# Patient Record
Sex: Male | Born: 1965 | Race: White | Hispanic: No | Marital: Single | Smoking: Current every day smoker
Health system: Southern US, Community
[De-identification: ages and names within clinical notes are randomized; demographics above are authoritative.]

## PROBLEM LIST (undated history)

## (undated) DIAGNOSIS — F32A Depression, unspecified: Secondary | ICD-10-CM

## (undated) DIAGNOSIS — G4730 Sleep apnea, unspecified: Secondary | ICD-10-CM

## (undated) DIAGNOSIS — I639 Cerebral infarction, unspecified: Secondary | ICD-10-CM

## (undated) DIAGNOSIS — R569 Unspecified convulsions: Secondary | ICD-10-CM

## (undated) DIAGNOSIS — F329 Major depressive disorder, single episode, unspecified: Secondary | ICD-10-CM

## (undated) DIAGNOSIS — F419 Anxiety disorder, unspecified: Secondary | ICD-10-CM

## (undated) DIAGNOSIS — R51 Headache: Secondary | ICD-10-CM

## (undated) DIAGNOSIS — I2510 Atherosclerotic heart disease of native coronary artery without angina pectoris: Secondary | ICD-10-CM

## (undated) DIAGNOSIS — I10 Essential (primary) hypertension: Secondary | ICD-10-CM

## (undated) DIAGNOSIS — R0602 Shortness of breath: Secondary | ICD-10-CM

## (undated) DIAGNOSIS — N189 Chronic kidney disease, unspecified: Secondary | ICD-10-CM

## (undated) DIAGNOSIS — I219 Acute myocardial infarction, unspecified: Secondary | ICD-10-CM

## (undated) HISTORY — PX: LEG SURGERY: SHX1003

---

## 2001-04-14 ENCOUNTER — Encounter: Payer: Self-pay | Admitting: Emergency Medicine

## 2001-04-14 ENCOUNTER — Emergency Department (HOSPITAL_COMMUNITY): Admission: EM | Admit: 2001-04-14 | Discharge: 2001-04-14 | Payer: Self-pay | Admitting: *Deleted

## 2001-07-09 ENCOUNTER — Emergency Department (HOSPITAL_COMMUNITY): Admission: EM | Admit: 2001-07-09 | Discharge: 2001-07-09 | Payer: Self-pay | Admitting: Emergency Medicine

## 2001-07-23 ENCOUNTER — Emergency Department (HOSPITAL_COMMUNITY): Admission: EM | Admit: 2001-07-23 | Discharge: 2001-07-23 | Payer: Self-pay | Admitting: Emergency Medicine

## 2001-07-30 ENCOUNTER — Emergency Department (HOSPITAL_COMMUNITY): Admission: EM | Admit: 2001-07-30 | Discharge: 2001-07-30 | Payer: Self-pay | Admitting: Emergency Medicine

## 2010-10-02 ENCOUNTER — Inpatient Hospital Stay (HOSPITAL_COMMUNITY)
Admission: EM | Admit: 2010-10-02 | Discharge: 2010-10-05 | DRG: 305 | Disposition: A | Discharge status: Home / Self Care | Payer: Self-pay | Source: Ambulatory Visit | Attending: Internal Medicine | Admitting: Internal Medicine

## 2010-10-02 ENCOUNTER — Emergency Department (HOSPITAL_COMMUNITY): Payer: Self-pay

## 2010-10-02 ENCOUNTER — Encounter (HOSPITAL_COMMUNITY): Payer: Self-pay

## 2010-10-02 ENCOUNTER — Inpatient Hospital Stay (HOSPITAL_COMMUNITY): Payer: Self-pay

## 2010-10-02 DIAGNOSIS — I2510 Atherosclerotic heart disease of native coronary artery without angina pectoris: Secondary | ICD-10-CM | POA: Diagnosis present

## 2010-10-02 DIAGNOSIS — K5730 Diverticulosis of large intestine without perforation or abscess without bleeding: Secondary | ICD-10-CM | POA: Diagnosis present

## 2010-10-02 DIAGNOSIS — R112 Nausea with vomiting, unspecified: Secondary | ICD-10-CM | POA: Diagnosis present

## 2010-10-02 DIAGNOSIS — N289 Disorder of kidney and ureter, unspecified: Secondary | ICD-10-CM | POA: Diagnosis present

## 2010-10-02 DIAGNOSIS — E875 Hyperkalemia: Secondary | ICD-10-CM | POA: Diagnosis present

## 2010-10-02 DIAGNOSIS — R079 Chest pain, unspecified: Secondary | ICD-10-CM

## 2010-10-02 DIAGNOSIS — R0789 Other chest pain: Secondary | ICD-10-CM | POA: Diagnosis present

## 2010-10-02 DIAGNOSIS — R51 Headache: Secondary | ICD-10-CM | POA: Diagnosis present

## 2010-10-02 DIAGNOSIS — E119 Type 2 diabetes mellitus without complications: Secondary | ICD-10-CM | POA: Diagnosis present

## 2010-10-02 DIAGNOSIS — F17200 Nicotine dependence, unspecified, uncomplicated: Secondary | ICD-10-CM | POA: Diagnosis present

## 2010-10-02 DIAGNOSIS — E781 Pure hyperglyceridemia: Secondary | ICD-10-CM | POA: Diagnosis present

## 2010-10-02 DIAGNOSIS — I10 Essential (primary) hypertension: Principal | ICD-10-CM | POA: Diagnosis present

## 2010-10-02 DIAGNOSIS — Z7982 Long term (current) use of aspirin: Secondary | ICD-10-CM

## 2010-10-02 DIAGNOSIS — I252 Old myocardial infarction: Secondary | ICD-10-CM

## 2010-10-02 HISTORY — DX: Essential (primary) hypertension: I10

## 2010-10-02 LAB — POCT I-STAT, CHEM 8
BUN: 12 mg/dL (ref 6–23)
Calcium, Ion: 1.2 mmol/L (ref 1.12–1.32)
Chloride: 100 mEq/L (ref 96–112)
Creatinine, Ser: 1.3 mg/dL (ref 0.50–1.35)
Glucose, Bld: 100 mg/dL — ABNORMAL HIGH (ref 70–99)
HCT: 44 % (ref 39.0–52.0)
Hemoglobin: 15 g/dL (ref 13.0–17.0)
Potassium: 2.9 mEq/L — ABNORMAL LOW (ref 3.5–5.1)
Sodium: 140 mEq/L (ref 135–145)
TCO2: 24 mmol/L (ref 0–100)

## 2010-10-02 LAB — CBC
Hemoglobin: 14.4 g/dL (ref 13.0–17.0)
MCH: 29.9 pg (ref 26.0–34.0)
RBC: 4.82 MIL/uL (ref 4.22–5.81)
WBC: 6.9 10*3/uL (ref 4.0–10.5)

## 2010-10-02 LAB — TROPONIN I: Troponin I: <0.3 ng/mL (ref <0.30)

## 2010-10-02 LAB — DIFFERENTIAL
Basophils Absolute: 0.1 10*3/uL (ref 0.0–0.1)
Basophils Relative: 1 % (ref 0–1)
Eosinophils Absolute: 0.2 10*3/uL (ref 0.0–0.7)
Eosinophils Relative: 3 % (ref 0–5)
Lymphocytes Relative: 18 % (ref 12–46)
Lymphs Abs: 1.3 10*3/uL (ref 0.7–4.0)
Monocytes Absolute: 0.7 10*3/uL (ref 0.1–1.0)
Monocytes Relative: 11 % (ref 3–12)
Neutro Abs: 4.6 10*3/uL (ref 1.7–7.7)
Neutrophils Relative %: 67 % (ref 43–77)

## 2010-10-02 LAB — TSH: TSH: 3.215 u[IU]/mL (ref 0.350–4.500)

## 2010-10-02 LAB — CARDIAC PANEL(CRET KIN+CKTOT+MB+TROPI)
CK, MB: 4 ng/mL (ref 0.3–4.0)
Relative Index: INVALID (ref 0.0–2.5)
Total CK: 88 U/L (ref 7–232)

## 2010-10-02 LAB — HEMOGLOBIN A1C
Hgb A1c MFr Bld: 5.4 % (ref <5.7)
Mean Plasma Glucose: 108 mg/dL (ref <117)

## 2010-10-02 LAB — PRO B NATRIURETIC PEPTIDE: Pro B Natriuretic peptide (BNP): 580.7 pg/mL — ABNORMAL HIGH (ref 0–125)

## 2010-10-02 LAB — RAPID URINE DRUG SCREEN, HOSP PERFORMED
Amphetamines: NOT DETECTED
Benzodiazepines: NOT DETECTED
Cocaine: NOT DETECTED
Opiates: NOT DETECTED

## 2010-10-02 LAB — BASIC METABOLIC PANEL
Calcium: 9.7 mg/dL (ref 8.4–10.5)
Creatinine, Ser: 1.77 mg/dL — ABNORMAL HIGH (ref 0.50–1.35)
GFR calc non Af Amer: 41 mL/min — ABNORMAL LOW (ref >60)
Sodium: 139 mEq/L (ref 135–145)

## 2010-10-02 LAB — GLUCOSE, CAPILLARY
Glucose-Capillary: 104 mg/dL — ABNORMAL HIGH (ref 70–99)
Glucose-Capillary: 100 mg/dL — ABNORMAL HIGH (ref 70–99)
Glucose-Capillary: 136 mg/dL — ABNORMAL HIGH (ref 70–99)

## 2010-10-02 LAB — MAGNESIUM: Magnesium: 2.1 mg/dL (ref 1.5–2.5)

## 2010-10-02 LAB — POCT I-STAT TROPONIN I: Troponin i, poc: 0 ng/mL (ref 0.00–0.08)

## 2010-10-03 LAB — RENAL FUNCTION PANEL
CO2: 28 mEq/L (ref 19–32)
Calcium: 9.6 mg/dL (ref 8.4–10.5)
GFR calc Af Amer: 57 mL/min — ABNORMAL LOW (ref >60)
GFR calc non Af Amer: 47 mL/min — ABNORMAL LOW (ref >60)
Glucose, Bld: 170 mg/dL — ABNORMAL HIGH (ref 70–99)
Phosphorus: 3.4 mg/dL (ref 2.3–4.6)
Potassium: 3.6 mEq/L (ref 3.5–5.1)
Sodium: 137 mEq/L (ref 135–145)

## 2010-10-03 LAB — CARDIAC PANEL(CRET KIN+CKTOT+MB+TROPI)
CK, MB: 2.9 ng/mL (ref 0.3–4.0)
Relative Index: INVALID (ref 0.0–2.5)
Total CK: 61 U/L (ref 7–232)

## 2010-10-03 LAB — GLUCOSE, CAPILLARY
Glucose-Capillary: 106 mg/dL — ABNORMAL HIGH (ref 70–99)
Glucose-Capillary: 101 mg/dL — ABNORMAL HIGH (ref 70–99)

## 2010-10-03 LAB — LIPID PANEL
Cholesterol: 146 mg/dL (ref 0–200)
Total CHOL/HDL Ratio: 7.7 RATIO

## 2010-10-04 LAB — BASIC METABOLIC PANEL
CO2: 29 mEq/L (ref 19–32)
Glucose, Bld: 97 mg/dL (ref 70–99)
Potassium: 3.9 mEq/L (ref 3.5–5.1)
Sodium: 139 mEq/L (ref 135–145)

## 2010-10-04 LAB — GLUCOSE, CAPILLARY: Glucose-Capillary: 98 mg/dL (ref 70–99)

## 2010-10-04 LAB — CBC
Hemoglobin: 13.8 g/dL (ref 13.0–17.0)
MCH: 29.6 pg (ref 26.0–34.0)
RBC: 4.66 MIL/uL (ref 4.22–5.81)

## 2010-10-05 ENCOUNTER — Inpatient Hospital Stay (HOSPITAL_COMMUNITY): Payer: Self-pay

## 2010-10-05 LAB — GLUCOSE, CAPILLARY
Glucose-Capillary: 79 mg/dL (ref 70–99)
Glucose-Capillary: 130 mg/dL — ABNORMAL HIGH (ref 70–99)
Glucose-Capillary: 113 mg/dL — ABNORMAL HIGH (ref 70–99)

## 2010-10-05 MED ORDER — GADOBENATE DIMEGLUMINE 529 MG/ML IV SOLN
20.0000 mL | Freq: Once | INTRAVENOUS | Status: AC
  Administered 2010-10-05: 20 mL via INTRAVENOUS

## 2010-10-08 NOTE — Consult Note (Addendum)
NAMESHOLEM, VENHUIZEN               ACCOUNT NO.:  [id number]  MEDICAL RECORD NO.:  [id number]  LOCATION:  [facility]                         FACILITY:  [facility]  PHYSICIAN:  Marcello Moores C. Hubbert Landrigan, MD, FACCDATE OF BIRTH:  22-May-1965  DATE OF CONSULTATION:  10/02/2010 DATE OF DISCHARGE:                                CONSULTATION   PRIMARY CARDIOLOGIST:  [facility].  PRIMARY MEDICAL DOCTOR:  [facility] in [place].  CHIEF COMPLAINT:  Chest pain, headache, nausea, and  vomiting.  HISTORY OF PRESENT ILLNESS:  Mr. Salber is a 45 year old gentleman with a reported history of CAD, hypertension, hyperlipidemia, diabetes, and prior drug and alcohol abuse, who was admitted with chest pain, headache, nausea, vomiting, and hypertensive crisis.  He was walking to the bar last night when he developed sudden left chest pain, which he describes as a sharp knife twisting in his upper chest and then felt someone was bear hugging him.  He felt sweaty, nauseous, and vomited approximately 8 times.  He got too painful and then called EMS.  He states sublingual nitroglycerin made his symptoms worse and give him headache.  Blood pressure was noted to be 250/150 by EMS per ER report. The patient received morphine, Toradol, GI cocktail, and IV nitroglycerin in the ER with gradual reduction and pain down to 2/10. He still has residual headache.  His blood pressures improved to 130s/150s.  EKG demonstrates normal sinus ryythm and ST depression at II, III, aVF, and T-wave inversion V4-V6, although this was taken when his blood pressure was markedly elevated.  His symptoms reminded him of his prior MI, but the pain is reproducible with certain movements like sitting up or moving his arms, he describes it like a pulling sensation. He also has tenderness on his left chest Katriana Dortch, but thinks this may be a different pain.  Troponin is negative x1.  PAST MEDICAL HISTORY: 1. Coronary artery disease per patient report.  He  states he had an MI     in 1998 and 2000 with the one of his catheterization being in     [place] area with angioplasty.  He does not necessarily know all the     details.  He states he had a nuclear study 2 months ago at [facility] 2. Hyperlipidemia/hypertriglyceridemia. 3. Nephrolithiasis. 4. Diverticulitis. 5. Diabetes mellitus. 6. Hypertension. 7. History of alcohol and drug abuse.     a.     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     b.     Hospital admission to [facility], March 2012 demonstrates      that concern for narcotic resistance and the patient requesting      morphine and Dilaudid finding.  SURGICAL HISTORY:  Left lower extremity compartment surgery.  OUTPATIENT MEDICATIONS:  No need to be verified with pharmacy, but the patient is able to request some of the names which include pravastatin, metformin, Glucotrol, aspirin, lisinopril, clonidine, and metoprolol.  ALLERGIES:  IV DYE and KEFLEX.  SOCIAL HISTORY:  Mr. Laughman lives in [place].  He is divorced but is now engaged to be married in  November.  He has one daughter of his own ho is 54 and his fiance has 3 children.  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  He smokes half pack per day.  He states 15 years ago returned sober from alcohol and 15 years ago he also gave up methamphetamine and THC.  Urine drug screen is negative.  FAMILY HISTORY:  Mother had Alzheimer's and passed away of a massive MI in her 22s.  He states his father had a very poor health as a result of alcohol, occluded heart disease, hypertension, and diabetes.  REVIEW OF SYSTEMS:  The patient felt well up until yesterday evening. No fevers, chills.  He has some trace lower extremity edema.  All other systems reviewed and otherwise negative.  LABS:  WBC 6.9, hemoglobin 14.4, hematocrit 40.3, platelet count 235. Sodium 140, potassium 2.9, chloride 100,  glucose 100, BUN 12, creatinine 1.3.  BNP 580.  Point-of-care troponin negative x1. Urine drug screen negative.  Alcohol level does not appear to be have been done.  STUDIES: 1. Chest x-ray showed suboptimal position, but no active disease. 2. EKG normal sinus rhythm with ST depression II, III, aVF, and T-wave     inversion at V4-V6 neck.  PHYSICAL EXAMINATION:  VITAL SIGNS:  Temperature 98.5, pulse 85, respirations 18, blood pressure 125/66, pulse ox 99% on 2 liters. GENERAL:  This is a pleasant, but somewhat anxious appearing white male, in no acute distress. HEENT:  Normocephalic, atraumatic with extraocular movements intact. Clear sclerae.  Nares are without discharge. NECK:  Supple with a left carotid bruit. HEART:  Auscultation of the heart reveals regular rate and rhythm with S1 and S2 without murmurs, rubs, or gallops. LUNGS:  Clear to auscultation bilaterally without wheezes, rales, or rhonchi. ABDOMEN:  Soft, nontender, nontender.  Positive bowel sounds. EXTREMITIES:  Warm, dry, with trace sock line edema. NEUROLOGIC:  He is alert and oriented x3, responds questions appropriately, but had an anxious affect and at times seems preoccupied with bringing up his weight.  ASSESSMENT AND PLAN:  The patient was seen and examined by Dr. Verl Blalock and myself.  This is a 45 year old gentleman with reported history of coronary artery disease, hypertension, diabetes, prior drug and alcohol abuse with negative urine drug screen, who presents with symptoms of chest pain, nausea, vomiting, and headache in the setting of hypertensive crisis.  His chest pain at this time is felt somewhat atypical, and is reproducible with certain movements.  However, does remind him of his prior MI.  We do recommend that the patient be admitted to hospital for rule out cardiac enzymes.  EKG changes may be due to hypertension, but will repeat in the morning.  He reportedly had a stress test 2 months ago, the  results of which we will attempt to obtain.  Once the patient's home medications are known, we would recommend to continue a good cardiac regimen that would include beta-blockade and statin on top of the aspirin and IV nitroglycerin that he will already be receiving.  Further recommendations will be determined based on the above clinical data.     Dayna Dunn, P.A.C.   ______________________________ Marijo Conception Verl Blalock, MD, [facility]    DD/MEDQ  D:  10/02/2010  T:  10/02/2010  Job:  [id number]  cc:   Juley Giovanetti C. Verl Blalock, MD, [facility]  Electronically Signed by Melina Copa  on 10/17/2010 09:23:10 PM Electronically Signed by Jenell Milliner MD [facility]  on 11/04/2010 01:46:48 PM

## 2010-10-14 NOTE — H&P (Signed)
NAMEDALEON, BUZBY NO.:  [id number]  MEDICAL RECORD NO.:  [id number]  LOCATION:  [facility]                         FACILITY:  [facility]  PHYSICIAN:  Rise Patience, MDDATE OF BIRTH:  [birth year]  DATE OF ADMISSION:  10/02/2010 DATE OF DISCHARGE:                             HISTORY & PHYSICAL   PRIMARY CARE PHYSICIAN:  Unassigned.  [facility]  CHIEF COMPLAINT:  Chest pain.  HISTORY OF PRESENT ILLNESS:  A 45 year old male with history of hypertension, diabetes mellitus type 2, previous history of coronary artery disease, has had undergone cardiac cath and had balloon angioplasty with no stents or surgery.  Has been experiencing chest pains since last afternoon.  The pain is more retrosternal with almost radiating to the neck along with the patient also had a severe headache which also was referring to his neck.  In the ER, the patient was found to have a high blood pressure of 181/30, was started on IV nitroglycerin.  The headache is largely resolved.  His chest is also resolved at this time.  The patient was admitted for further observation.  His cardiac enzymes have been negative.  EKG showing some nonspecific changes, slightly there is some T-wave changes in the lateral and inferior leads, but we do not have an old EKG to compare.  The patient denies any shortness of breath, denies any nausea, vomiting, abdominal pain, dizziness, loss of conscious, any visual symptoms. Denies any focal deficit.  Denies any abdominal pain, dysuria, discharge or diarrhea.  PAST MEDICAL HISTORY:  Diabetes mellitus type 2, hypertension, history of CAD status post balloon angioplasty with no stents or CABG.  MEDICATIONS PRIOR TO ADMISSION:  The patient is on aspirin, clonidine 0.3 mg p.o. t.i.d., metoprolol.  He is on metformin and Glucotrol, dose have to be verified.  ALLERGIES:  IV DYE, KEFLEX.  SOCIAL HISTORY:  The patient smokes  cigarettes.  Denies any alcohol or drug abuse.  Used to drink a lot before, but now he is cleared for almost 15 years.  FAMILY HISTORY:  Positive for Alzheimer dementia and diabetes and coronary artery disease.  REVIEW OF SYSTEMS:  As per history of present illness nothing else significant.  PHYSICAL EXAMINATION:  GENERAL:  The patient examined at bedside not in acute distress. VITAL SIGNS:  Blood pressure 130/80, pulse 85 per minute, temperature 98.5, respirations 18 per minute, O2 sat 98%. HEENT:  Anicteric.  No pallor.  No discharge from ears, eyes, nose or mouth. CHEST:  Bilateral air entry present.  No rhonchi,  no crepitation. HEART:  S1 and S2 heard. ABDOMEN:  Soft, nontender.  Bowel sounds heard. CNS:  The patient is alert, awake and oriented to time, place and person.  Moves upper and lower extremities 5/5. EXTREMITIES:  Peripheral pulses felt.  No edema.  No acute ischemic changes, cyanosis or clubbing.\  LABORATORY DATA:  EKG shows normal sinus rhythm with poor R-wave progression with T-wave changes in the lateral leads and inferior leads. I did not have any old EKG to compare.  Chest x-ray shows suboptimal positioning within its limitation, no acute process identified.  CBC WBC is 6.5, hemoglobin 15,  hematocrit 44, platelets 235.  Basic metabolic panel, sodium [id number], potassium 2.9, chloride 100, carbon dioxide 24, glucose 100, BUN 4, creatinine 1.3, troponin 0, BNP 580.  Drug screen is negative.  ASSESSMENT: 1. Chest pain. 2. Accelerated hypertension. 3. History of coronary artery disease, status post balloon     angioplasty. 4. History of headache. 5. History of diabetes mellitus type 2. 6. History of tobacco abuse. 7. Hyperkalemia.  PLAN: 1. At this time, admit the patient to telemetry. 2. For his chest pain and accelerated hypertension, the patient has     been started on IV nitroglycerin which we will continue for now.     We will continue his clonidine he  states he takes 0.3 p.o. t.i.d.,     and he will also continue his metoprolol dose has to be verified.     We are going to cycle cardiac markers, get 2-D echo.  I have     consulted [facility], we will follow with their     recommendation at this time. 3. Hypokalemia.  At this time, we are going to replace his potassium.     Given his history of hypertension, we need to check for secondary     cause of hypertension.  At this time, we are going to order serum     renin and aldosterone check ratio to primary rule out any primary     hyperaldosteronism. 4. Diabetes mellitus type 2, we will continue his present medications,     he says he is on metformin and Glucotrol.  We will hold metformin     while he is in the hospital.  We will place the patient on sliding     scale coverage for now.  We will check hemoglobin A1c. 5. The patient needs smoking cessation counseling. 6. For his headache.  At this time, his headache has resolved with     blood pressure control.  We are going to get a CT head without     contrast. 7. Further recommendation based on test order, clinical course and     consults recommendations.     Rise Patience, MD     ANK/MEDQ  D:  10/02/2010  T:  10/02/2010  Job:  [id number]  Electronically Signed by Gean Birchwood MD on 10/14/2010 11:59:54 AM

## 2010-10-25 NOTE — Discharge Summary (Signed)
Austin Shepard, JUHNKE NO.:  [id number]  MEDICAL RECORD NO.:  [id number]  LOCATION:  2030                         FACILITY:  [facility]  PHYSICIAN:  Leana Gamer, MDDATE OF BIRTH:  1965/07/04  DATE OF ADMISSION:  10/02/2010 DATE OF DISCHARGE:  10/05/2010                              DISCHARGE SUMMARY   DISCHARGE DISPOSITION:  Home.  FINAL DISCHARGE DIAGNOSES: 1. Chest pain felt to be noncardiac. 2. Malignant hypertension, now better controlled. 3. Headache, controlled. 4. Diabetes type 2, controlled. 5. Coronary artery disease, status post angioplasty without any     standing or bypass. 6. Chronic diastolic dysfunction, compensated. 7. Hypertriglyceridemia/hyperlipidemia.  Discharge medications include the following: 1. Metformin 500 mg p.o. b.i.d. 2. Glucotrol 5 mg p.o. b.i.d. 3. Aspirin 325 mg p.o. daily. 4. Fioricet one tablet p.o. q.6 h. p.r.n. pain. 5. Hydrochlorothiazide 12.5 mg p.o. daily. 6. Lisinopril 20 mg p.o. daily. 7. Metoprolol XL 25 mg p.o. daily. 8. Simvastatin 20 mg p.o. daily. 9. Clonidine 0.3 mg p.o. t.i.d.  CONSULTANTS:  Dr. Jenell Milliner, [facility].  PROCEDURES:  None.  DIAGNOSTIC STUDIES: 1. Portable chest x-ray on admission, which shows no acute process. 2. CT of the head without contrast, which shows no acute     intraparenchymal disease.  Minimal sinus disease.  No air-fluid     levels. 3. MRA of the abdomen with and without contrast, which shows normal.     MRA demonstrated widely patent bilateral single renal arteries     without evidence of renal artery stenosis or fibromuscular     dysplasia.  The abdominal aorta and other distal branch vessels are     normal. 4. A 2-D echocardiogram without contrast, which shows normal left     ventricular function with ejection fraction of 60-65%.  There is     moderate concentric hypertrophy and Doppler parameters consistent     with grade 1 diastolic  dysfunction.  PRIMARY CARE PHYSICIAN:  The patient attends the Adult [facility] in [facility].  ALLERGIES: 1. KEFLEX. 2. IVP DYE.  CODE STATUS:  Full code.  CHIEF COMPLAINT:  Chest pain.  HISTORY OF PRESENT ILLNESS:  Please refer to the H and P by Dr. Hal Hope for details of the HPI; however, in short, this is a 45 year old gentleman with a history of coronary artery disease, hypertension, diabetes type 2, who presents to the emergency room with chest pain. The patient is also found to have elevated blood pressures of 181/113 and was started on IV nitroglycerin.  The patient was refer to [facility] for further evaluation and management.  HOSPITAL COURSE: 1. Malignant hypertension.  The patient was found to have malignant     hypertension, which is felt to be possibly contributing to his     chest pain.  The patient was placed on IV nitroglycerin and then     transitioned over to oral hypertensive.  The patient's blood     pressures now under good control with oral medications.  The     patient is asked to follow up with his primary physician within a     week for further evaluation and titration of medications.  The     patient does have a blood pressure cuff at home and does take his     blood pressures.  He states that usually at home on several times a     week his blood pressures are diastolics greater than [id number] and     systolic greater than [id number].  It is clear that this patient needs     close follow up with his blood pressure as an outpatient. 2. Diabetes type 2.  The patient had diabetes type 2, it is controlled     based on a hemoglobin A1c of 5.3.  The patient takes metformin and     Glucotrol at home.  During his hospitalization, however, because of     the studies, he was taken off of his medications and treated with     insulin.  The patient will be resumed on his medications.  As he     takes these as an outpatient as they have been providing good      control. 3. Chest pain.  The chest pain was found to be atypical.  In light of     the patient having coronary artery disease, Cardiology was     consulted.  However, they felt that this pain was atypical and did     not represent an acute coronary syndrome and recommended that the     patient continue to have good medical management as noted above. 4. Hyperlipidemia.  The patient was found to have triglycerides     elevated at 517, thus VLDL could not be calculated.  The patient is     on simvastatin and should have his hypertriglyceridemia further to     be evaluated as an outpatient.  At the time of discharge, the patient is stable.  PHYSICAL EXAMINATION:  VITAL SIGNS:  Temperature is 98.8, heart rate 74, blood pressure 115/76, respiratory rate 18, O2 sats are 96% on room air. GENERAL:  The patient is up ambulating without any dizziness or any signs of orthostatic hypertension. HEENT:  He is normocephalic and atraumatic.  Pupils are equally round and reactive to light and accommodation.  Extraocular movements are intact.  Oropharynx is moist.  No exudate, erythema or lesions are noted. NECK:  The trachea is midline.  No masses, no thyromegaly, no JVD, no carotid bruit. RESPIRATORY:  The patient has a normal respiratory effort, equal excursion bilaterally, and no wheezing or rhonchi noted. CARDIOVASCULAR:  He has got a normal S1 and S2.  No murmurs, rubs, or gallops are noted.  PMI is nondisplaced.  No heaves or thrills on palpation. ABDOMEN:  Obese, soft, nontender, and nondistended.  No masses, no hepatosplenomegaly is noted. EXTREMITIES:  Show no clubbing, cyanosis, or edema. NEUROLOGIC:  The patient has no focal neurological deficits.  Cranial nerves II through XII are grossly intact. PSYCHIATRIC:  The patient is alert and oriented x3.  DIETARY RESTRICTIONS:  The patient should be on a diabetic heart-healthy diet.  PHYSICAL RESTRICTIONS:  None.  FOLLOWUP:  The patient  has a followup appointment scheduled with the [facility] in [facility] on October 22, 2010 at 1:15 p.m.     Leana Gamer, MD     MAM/MEDQ  D:  10/05/2010  T:  10/06/2010  Job:  [id number]  cc:   Thomas C. Verl Blalock, MD, [facility]  Electronically Signed by Liston Alba MD on 10/25/2010 07:23:22 AM

## 2012-05-28 ENCOUNTER — Inpatient Hospital Stay (HOSPITAL_COMMUNITY)
Admission: EM | Admit: 2012-05-28 | Discharge: 2012-05-30 | DRG: 303 | Disposition: A | Discharge status: Home / Self Care | Payer: Medicaid Other | Attending: Internal Medicine | Admitting: Internal Medicine

## 2012-05-28 ENCOUNTER — Encounter (HOSPITAL_COMMUNITY): Payer: Self-pay | Admitting: Emergency Medicine

## 2012-05-28 ENCOUNTER — Inpatient Hospital Stay (HOSPITAL_COMMUNITY): Payer: Medicaid Other

## 2012-05-28 ENCOUNTER — Emergency Department (HOSPITAL_COMMUNITY): Payer: Medicaid Other

## 2012-05-28 DIAGNOSIS — K219 Gastro-esophageal reflux disease without esophagitis: Secondary | ICD-10-CM

## 2012-05-28 DIAGNOSIS — I2510 Atherosclerotic heart disease of native coronary artery without angina pectoris: Principal | ICD-10-CM

## 2012-05-28 DIAGNOSIS — F329 Major depressive disorder, single episode, unspecified: Secondary | ICD-10-CM | POA: Diagnosis present

## 2012-05-28 DIAGNOSIS — I252 Old myocardial infarction: Secondary | ICD-10-CM

## 2012-05-28 DIAGNOSIS — I10 Essential (primary) hypertension: Secondary | ICD-10-CM

## 2012-05-28 DIAGNOSIS — F3289 Other specified depressive episodes: Secondary | ICD-10-CM | POA: Diagnosis present

## 2012-05-28 DIAGNOSIS — F411 Generalized anxiety disorder: Secondary | ICD-10-CM | POA: Diagnosis present

## 2012-05-28 DIAGNOSIS — R29898 Other symptoms and signs involving the musculoskeletal system: Secondary | ICD-10-CM | POA: Diagnosis present

## 2012-05-28 DIAGNOSIS — K920 Hematemesis: Secondary | ICD-10-CM | POA: Diagnosis present

## 2012-05-28 DIAGNOSIS — E669 Obesity, unspecified: Secondary | ICD-10-CM | POA: Diagnosis present

## 2012-05-28 DIAGNOSIS — Z91199 Patient's noncompliance with other medical treatment and regimen due to unspecified reason: Secondary | ICD-10-CM

## 2012-05-28 DIAGNOSIS — Z9981 Dependence on supplemental oxygen: Secondary | ICD-10-CM

## 2012-05-28 DIAGNOSIS — R072 Precordial pain: Secondary | ICD-10-CM

## 2012-05-28 DIAGNOSIS — F17200 Nicotine dependence, unspecified, uncomplicated: Secondary | ICD-10-CM | POA: Diagnosis present

## 2012-05-28 DIAGNOSIS — E119 Type 2 diabetes mellitus without complications: Secondary | ICD-10-CM | POA: Diagnosis present

## 2012-05-28 DIAGNOSIS — F1911 Other psychoactive substance abuse, in remission: Secondary | ICD-10-CM | POA: Diagnosis present

## 2012-05-28 DIAGNOSIS — Z720 Tobacco use: Secondary | ICD-10-CM | POA: Diagnosis present

## 2012-05-28 DIAGNOSIS — F32A Depression, unspecified: Secondary | ICD-10-CM | POA: Diagnosis present

## 2012-05-28 DIAGNOSIS — G8929 Other chronic pain: Secondary | ICD-10-CM | POA: Diagnosis present

## 2012-05-28 DIAGNOSIS — R51 Headache: Secondary | ICD-10-CM

## 2012-05-28 DIAGNOSIS — Z6841 Body Mass Index (BMI) 40.0 and over, adult: Secondary | ICD-10-CM

## 2012-05-28 DIAGNOSIS — E876 Hypokalemia: Secondary | ICD-10-CM

## 2012-05-28 DIAGNOSIS — Z9119 Patient's noncompliance with other medical treatment and regimen: Secondary | ICD-10-CM

## 2012-05-28 DIAGNOSIS — R519 Headache, unspecified: Secondary | ICD-10-CM | POA: Diagnosis present

## 2012-05-28 DIAGNOSIS — F419 Anxiety disorder, unspecified: Secondary | ICD-10-CM | POA: Diagnosis present

## 2012-05-28 DIAGNOSIS — Z8673 Personal history of transient ischemic attack (TIA), and cerebral infarction without residual deficits: Secondary | ICD-10-CM

## 2012-05-28 DIAGNOSIS — Z794 Long term (current) use of insulin: Secondary | ICD-10-CM

## 2012-05-28 DIAGNOSIS — I69998 Other sequelae following unspecified cerebrovascular disease: Secondary | ICD-10-CM

## 2012-05-28 DIAGNOSIS — R079 Chest pain, unspecified: Secondary | ICD-10-CM

## 2012-05-28 DIAGNOSIS — K922 Gastrointestinal hemorrhage, unspecified: Secondary | ICD-10-CM

## 2012-05-28 DIAGNOSIS — G4733 Obstructive sleep apnea (adult) (pediatric): Secondary | ICD-10-CM | POA: Diagnosis present

## 2012-05-28 HISTORY — DX: Acute myocardial infarction, unspecified: I21.9

## 2012-05-28 HISTORY — DX: Major depressive disorder, single episode, unspecified: F32.9

## 2012-05-28 HISTORY — DX: Atherosclerotic heart disease of native coronary artery without angina pectoris: I25.10

## 2012-05-28 HISTORY — DX: Sleep apnea, unspecified: G47.30

## 2012-05-28 HISTORY — DX: Shortness of breath: R06.02

## 2012-05-28 HISTORY — DX: Unspecified convulsions: R56.9

## 2012-05-28 HISTORY — DX: Headache: R51

## 2012-05-28 HISTORY — DX: Chronic kidney disease, unspecified: N18.9

## 2012-05-28 HISTORY — DX: Depression, unspecified: F32.A

## 2012-05-28 HISTORY — DX: Cerebral infarction, unspecified: I63.9

## 2012-05-28 HISTORY — DX: Anxiety disorder, unspecified: F41.9

## 2012-05-28 LAB — HEMOGLOBIN AND HEMATOCRIT, BLOOD
HCT: 41 % (ref 39.0–52.0)
Hemoglobin: 14.7 g/dL (ref 13.0–17.0)

## 2012-05-28 LAB — BASIC METABOLIC PANEL
GFR calc Af Amer: 82 mL/min — ABNORMAL LOW (ref >90)
GFR calc non Af Amer: 71 mL/min — ABNORMAL LOW (ref >90)
Potassium: 3 mEq/L — ABNORMAL LOW (ref 3.5–5.1)
Sodium: 141 mEq/L (ref 135–145)

## 2012-05-28 LAB — GLUCOSE, CAPILLARY: Glucose-Capillary: 124 mg/dL — ABNORMAL HIGH (ref 70–99)

## 2012-05-28 LAB — CBC
Hemoglobin: 15.4 g/dL (ref 13.0–17.0)
MCHC: 36 g/dL (ref 30.0–36.0)
RBC: 5.06 MIL/uL (ref 4.22–5.81)

## 2012-05-28 LAB — HEPATIC FUNCTION PANEL
ALT: 20 U/L (ref 0–53)
AST: 22 U/L (ref 0–37)
Alkaline Phosphatase: 94 U/L (ref 39–117)
Bilirubin, Direct: <0.1 mg/dL (ref 0.0–0.3)
Total Bilirubin: 0.3 mg/dL (ref 0.3–1.2)

## 2012-05-28 LAB — POCT I-STAT TROPONIN I: Troponin i, poc: 0 ng/mL (ref 0.00–0.08)

## 2012-05-28 LAB — TYPE AND SCREEN
ABO/RH(D): O POS
Antibody Screen: NEGATIVE

## 2012-05-28 MED ORDER — SODIUM CHLORIDE 0.9 % IJ SOLN
3.0000 mL | Freq: Two times a day (BID) | INTRAMUSCULAR | Status: DC
  Administered 2012-05-28 – 2012-05-30 (×4): 3 mL via INTRAVENOUS

## 2012-05-28 MED ORDER — CLONAZEPAM 1 MG PO TABS
2.0000 mg | ORAL_TABLET | Freq: Every day | ORAL | Status: DC
  Administered 2012-05-28 – 2012-05-29 (×2): 2 mg via ORAL
  Filled 2012-05-28 (×2): qty 2

## 2012-05-28 MED ORDER — OXYBUTYNIN CHLORIDE ER 5 MG PO TB24
5.0000 mg | ORAL_TABLET | Freq: Every day | ORAL | Status: DC
  Administered 2012-05-28 – 2012-05-29 (×2): 5 mg via ORAL
  Filled 2012-05-28 (×4): qty 1

## 2012-05-28 MED ORDER — MORPHINE SULFATE 4 MG/ML IJ SOLN
4.0000 mg | Freq: Once | INTRAMUSCULAR | Status: AC
  Administered 2012-05-28: 4 mg via INTRAVENOUS
  Filled 2012-05-28: qty 1

## 2012-05-28 MED ORDER — INSULIN ASPART 100 UNIT/ML ~~LOC~~ SOLN
0.0000 [IU] | Freq: Three times a day (TID) | SUBCUTANEOUS | Status: DC
  Administered 2012-05-29: 1 [IU] via SUBCUTANEOUS

## 2012-05-28 MED ORDER — LORAZEPAM 2 MG/ML IJ SOLN
1.0000 mg | Freq: Once | INTRAMUSCULAR | Status: AC
  Administered 2012-05-28: 1 mg via INTRAVENOUS
  Filled 2012-05-28: qty 1

## 2012-05-28 MED ORDER — METOPROLOL TARTRATE 1 MG/ML IV SOLN
5.0000 mg | Freq: Once | INTRAVENOUS | Status: AC
  Administered 2012-05-28: 5 mg via INTRAVENOUS
  Filled 2012-05-28: qty 5

## 2012-05-28 MED ORDER — NITROGLYCERIN 2 % TD OINT
1.0000 [in_us] | TOPICAL_OINTMENT | Freq: Four times a day (QID) | TRANSDERMAL | Status: DC
  Administered 2012-05-28 – 2012-05-29 (×3): 1 [in_us] via TOPICAL
  Filled 2012-05-28: qty 30

## 2012-05-28 MED ORDER — PANTOPRAZOLE SODIUM 40 MG IV SOLR
40.0000 mg | Freq: Once | INTRAVENOUS | Status: DC
  Filled 2012-05-28: qty 40

## 2012-05-28 MED ORDER — ACETAMINOPHEN 650 MG RE SUPP: 650.0000 mg | Freq: Four times a day (QID) | RECTAL | Status: DC | PRN

## 2012-05-28 MED ORDER — MORPHINE SULFATE 4 MG/ML IJ SOLN
6.0000 mg | Freq: Once | INTRAMUSCULAR | Status: AC
  Administered 2012-05-28: 6 mg via INTRAVENOUS
  Filled 2012-05-28 (×2): qty 1

## 2012-05-28 MED ORDER — HYDROMORPHONE HCL PF 1 MG/ML IJ SOLN
1.0000 mg | INTRAMUSCULAR | Status: DC | PRN
  Administered 2012-05-28: 1 mg via INTRAVENOUS
  Administered 2012-05-29: 2 mg via INTRAVENOUS
  Administered 2012-05-29: 1 mg via INTRAVENOUS
  Filled 2012-05-28 (×2): qty 2
  Filled 2012-05-28 (×2): qty 1

## 2012-05-28 MED ORDER — NICOTINE 7 MG/24HR TD PT24
7.0000 mg | MEDICATED_PATCH | Freq: Every day | TRANSDERMAL | Status: DC
  Administered 2012-05-28 – 2012-05-30 (×3): 7 mg via TRANSDERMAL
  Filled 2012-05-28 (×4): qty 1

## 2012-05-28 MED ORDER — POTASSIUM CHLORIDE 10 MEQ/100ML IV SOLN
10.0000 meq | INTRAVENOUS | Status: AC
  Administered 2012-05-28 (×4): 10 meq via INTRAVENOUS
  Filled 2012-05-28: qty 100
  Filled 2012-05-28: qty 200
  Filled 2012-05-28: qty 100

## 2012-05-28 MED ORDER — ONDANSETRON HCL 4 MG/2ML IJ SOLN: 4.0000 mg | Freq: Three times a day (TID) | INTRAMUSCULAR | Status: AC | PRN

## 2012-05-28 MED ORDER — ACETAMINOPHEN 325 MG PO TABS
650.0000 mg | ORAL_TABLET | Freq: Four times a day (QID) | ORAL | Status: DC | PRN
  Administered 2012-05-29: 650 mg via ORAL
  Filled 2012-05-28: qty 2

## 2012-05-28 MED ORDER — SODIUM CHLORIDE 0.9 % IV SOLN
50.0000 ug/h | INTRAVENOUS | Status: DC
  Administered 2012-05-28: 50 ug/h via INTRAVENOUS
  Filled 2012-05-28 (×2): qty 1

## 2012-05-28 MED ORDER — ONDANSETRON HCL 4 MG/2ML IJ SOLN
4.0000 mg | Freq: Once | INTRAMUSCULAR | Status: AC
  Administered 2012-05-28: 4 mg via INTRAVENOUS
  Filled 2012-05-28: qty 2

## 2012-05-28 MED ORDER — PANTOPRAZOLE SODIUM 40 MG IV SOLR
40.0000 mg | Freq: Two times a day (BID) | INTRAVENOUS | Status: DC
  Administered 2012-05-28: 40 mg via INTRAVENOUS
  Filled 2012-05-28 (×3): qty 40

## 2012-05-28 MED ORDER — LAMOTRIGINE 200 MG PO TABS
200.0000 mg | ORAL_TABLET | Freq: Two times a day (BID) | ORAL | Status: DC
  Administered 2012-05-28 – 2012-05-30 (×4): 200 mg via ORAL
  Filled 2012-05-28 (×7): qty 1

## 2012-05-28 MED ORDER — ONDANSETRON HCL 4 MG PO TABS
4.0000 mg | ORAL_TABLET | Freq: Four times a day (QID) | ORAL | Status: DC | PRN
  Administered 2012-05-30: 4 mg via ORAL
  Filled 2012-05-28: qty 1

## 2012-05-28 MED ORDER — SENNOSIDES-DOCUSATE SODIUM 8.6-50 MG PO TABS
1.0000 | ORAL_TABLET | Freq: Every evening | ORAL | Status: DC | PRN
  Filled 2012-05-28: qty 1

## 2012-05-28 MED ORDER — TOPIRAMATE 25 MG PO TABS
50.0000 mg | ORAL_TABLET | Freq: Two times a day (BID) | ORAL | Status: DC
  Administered 2012-05-28 – 2012-05-30 (×4): 50 mg via ORAL
  Filled 2012-05-28 (×6): qty 2

## 2012-05-28 MED ORDER — ONDANSETRON HCL 4 MG/2ML IJ SOLN
4.0000 mg | Freq: Four times a day (QID) | INTRAMUSCULAR | Status: DC | PRN
  Administered 2012-05-28 – 2012-05-29 (×2): 4 mg via INTRAVENOUS
  Filled 2012-05-28 (×2): qty 2

## 2012-05-28 MED ORDER — SODIUM CHLORIDE 0.9 % IV SOLN
INTRAVENOUS | Status: DC
  Administered 2012-05-28: 1000 mL via INTRAVENOUS

## 2012-05-28 MED ORDER — FENTANYL CITRATE 0.05 MG/ML IJ SOLN
100.0000 ug | Freq: Once | INTRAMUSCULAR | Status: AC
  Administered 2012-05-28: 100 ug via INTRAVENOUS
  Filled 2012-05-28: qty 2

## 2012-05-28 MED ORDER — CARBAMAZEPINE 200 MG PO TABS
400.0000 mg | ORAL_TABLET | Freq: Two times a day (BID) | ORAL | Status: DC
  Administered 2012-05-28 – 2012-05-30 (×4): 400 mg via ORAL
  Filled 2012-05-28 (×7): qty 2

## 2012-05-28 MED ORDER — NICARDIPINE HCL IN NACL 20-0.86 MG/200ML-% IV SOLN
5.0000 mg/h | Freq: Once | INTRAVENOUS | Status: AC
  Administered 2012-05-28: 5 mg/h via INTRAVENOUS
  Filled 2012-05-28: qty 200

## 2012-05-28 MED ORDER — GABAPENTIN 300 MG PO CAPS
600.0000 mg | ORAL_CAPSULE | Freq: Three times a day (TID) | ORAL | Status: DC
  Administered 2012-05-28 – 2012-05-30 (×5): 600 mg via ORAL
  Filled 2012-05-28 (×10): qty 2

## 2012-05-28 MED ORDER — LABETALOL HCL 200 MG PO TABS
200.0000 mg | ORAL_TABLET | Freq: Three times a day (TID) | ORAL | Status: DC
  Administered 2012-05-29 (×2): 200 mg via ORAL
  Filled 2012-05-28 (×5): qty 1

## 2012-05-28 MED ORDER — NITROGLYCERIN 0.4 MG SL SUBL
0.4000 mg | SUBLINGUAL_TABLET | SUBLINGUAL | Status: AC | PRN
  Administered 2012-05-28 (×3): 0.4 mg via SUBLINGUAL
  Filled 2012-05-28: qty 25

## 2012-05-28 MED ORDER — HYDRALAZINE HCL 50 MG PO TABS
100.0000 mg | ORAL_TABLET | Freq: Three times a day (TID) | ORAL | Status: DC
  Administered 2012-05-28 – 2012-05-29 (×2): 100 mg via ORAL
  Filled 2012-05-28 (×6): qty 2

## 2012-05-28 MED ORDER — PANTOPRAZOLE SODIUM 40 MG IV SOLR
40.0000 mg | Freq: Once | INTRAVENOUS | Status: AC
  Administered 2012-05-28: 40 mg via INTRAVENOUS

## 2012-05-28 MED ORDER — BUSPIRONE HCL 10 MG PO TABS
10.0000 mg | ORAL_TABLET | Freq: Three times a day (TID) | ORAL | Status: DC
  Administered 2012-05-28 – 2012-05-30 (×5): 10 mg via ORAL
  Filled 2012-05-28 (×10): qty 1

## 2012-05-28 MED ORDER — MORPHINE SULFATE 4 MG/ML IJ SOLN: 4.0000 mg | Freq: Once | INTRAMUSCULAR | Status: DC

## 2012-05-28 NOTE — ED Provider Notes (Addendum)
History     CSN: [id number]  Arrival date & time 05/28/12  81   First MD Initiated Contact with Patient 05/28/12 1300      Chief Complaint  Patient presents with  . Chest Pain  . Shortness of Breath   Chief complaint:   Chest pain, vomiting  (Consider location/radiation/quality/duration/timing/severity/associated sxs/prior treatment) HPI.... chest pain since 10:00 this morning with associated vomiting. Also patient complains of headache and numbness of left arm. He has not been any using NSAIDs.  Used nitroglycerin at home with minimal success. 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 Also has hypertension. In the emergency department, he vomited approximately 100 cc of red blood. Level V caveat for urgent intervention  Past Medical History  Diagnosis Date  . Hypertension   . Diabetes mellitus     History reviewed. No pertinent past surgical history.  No family history on file.  History  Substance Use Topics  . Smoking status: Current Every Day Smoker -- 0.50 packs/day  . Smokeless tobacco: Not on file  . Alcohol Use: Not on file      Review of Systems  Unable to perform ROS: Acuity of condition    Allergies  Depakote; Ivp dye; Keflex; and Other  Home Medications  No current outpatient prescriptions on file.  BP 209/171  Pulse 94  Temp(Src) 98.1 F (36.7 C) (Oral)  Resp 18  Ht 5\' 4"  (1.626 m)  Wt 256 lb (116.121 kg)  BMI 43.92 kg/m2  SpO2 100%  Physical Exam  Nursing note and vitals reviewed. Constitutional: He is oriented to person, place, and time.  Obese, vomiting blood.  HENT:  Head: Normocephalic and atraumatic.  Eyes: Conjunctivae and EOM are normal. Pupils are equal, round, and reactive to light.  Neck: Normal range of motion. Neck supple.  Cardiovascular: Normal rate, regular rhythm and normal heart sounds.   Pulmonary/Chest: Effort normal and breath sounds normal.  Abdominal: Soft. Bowel sounds are normal.  Musculoskeletal:  Normal range of motion.  Neurological: He is alert and oriented to person, place, and time.  Skin: Skin is warm and dry.  Psychiatric: He has a normal mood and affect.    ED Course  Procedures (including critical care time)  Labs Reviewed  BASIC METABOLIC PANEL - Abnormal; Notable for the following:    Potassium 3.0 (*)    Glucose, Bld 108 (*)    GFR calc non Af Amer 71 (*)    GFR calc Af Amer 82 (*)    All other components within normal limits  CBC  POCT I-STAT TROPONIN I  TYPE AND SCREEN  ABO/RH    Dg Chest Portable 1 View  05/28/2012   *RADIOLOGY REPORT*  Clinical Data: Hematemesis.  Chest pain.  Shortness breath.  PORTABLE CHEST - 1 VIEW  Comparison: 10/02/2010  Findings: Chronic right mid thoracic rib deformity.  Otherwise unremarkable exam.  IMPRESSION:  1.  No acute findings.   Original Report Authenticated By: Van Clines, M.D.   No results found.   No diagnosis found.  Date: 05/28/2012  Rate: 104  Rhythm: sinus tachy  QRS Axis: normal  Intervals: normal  ST/T Wave abnormalities: normal  Conduction Disutrbances: none  Narrative Interpretation: unremarkable   CRITICAL CARE Performed by: Nat Christen Total critical care time: 30 Critical care time was exclusive of separately billable procedures and treating other patients. Critical care was necessary to treat or prevent imminent or life-threatening deterioration. Critical care was time spent personally by me on  the following activities: development of treatment plan with patient and/or surrogate as well as nursing, discussions with consultants, evaluation of patient's response to treatment, examination of patient, obtaining history from patient or surrogate, ordering and performing treatments and interventions, ordering and review of laboratory studies, ordering and review of radiographic studies, pulse oximetry and re-evaluation of patient's condition.   MDM  Suspect hemorrhagic gastritis as etiology of  hematemesis.   Will start Protonix drip. IV Lopressor and Cardene drip for blood pressure management.   Pain management. Admit to step down        Nat Christen, MD 05/28/12 [place], MD 05/28/12 [place], MD 06/10/12 [place], MD 06/10/12 [place], MD 06/10/12 [place], MD 06/16/12 [place], MD 06/16/12 [place], MD 06/16/12 [place], MD 07/08/12 1355

## 2012-05-28 NOTE — ED Notes (Signed)
CP decreased to 3/10 after 2nd NTG Sl tab.

## 2012-05-28 NOTE — H&P (Addendum)
[facility] History and Physical  Austin Shepard [id number] DOB: 18-Feb-1965 DOA: 05/28/2012  Referring physician: Nat Christen PCP:  Patient cannot remember Specialists:  Cardiologist in [place]  Chief Complaint: chest pain, headache, nausea  HPI: Austin Shepard is a 47 y.o. male with multiple medical problems including heart disease per his report who presents with crushing left-sided chest pain. He has multiple other complaints as well. Currently, his most bothersome symptom is severe headache. He is a meandering and somewhat difficult historian. Radiated to his neck and left arm. He felt nauseated and short of breath with it. It feels similar to his previous heart related pain. He has not received any nitroglycerin. He still has the pain. Initial EKG and troponin are unremarkable. He was admitted 2 years ago within the [facility] health system for chest pain. He ruled out for MI and the pain was felt to be noncardiac. He reports that he has been admitted to [facility] "more than 36 times". He reports having had cardiac catheterization with balloon angioplasty in 1999, none since. He reports having had a nuclear stress test sometime within the last few years. In 2012, a reference was made to a stress test on that year at an outside facility. In the emergency room, he was found initially to have a blood pressure of 209/171. He was started on a Cardene drip and given IV metoprolol. His blood pressure is now 120/65. At some point in the emergency room he had large-volume hematemesis. He reports a history of acid reflux, but no previous hematemesis. He has had upper and lower endoscopy but does not recall when or with whom. His hemoglobin is 15. He currently feels slightly nauseated but is asking for water. He was given IV protonix. Apparently, an accidental order was placed by the ED physician and he was started on a Sandostatin drip. Patient has no history of bleeding varices. Dr. Lacinda Axon  reports that he meant to order a protonix drip. He did not receive aspirin due to the hematemesis. He takes Plavix daily. 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 He denies heavy NSAID use. He denies alcohol or drug use. He reports that the pain started at the same time as the chest pain. The nausea started at the same time as well. Denies abdominal pain. Had a normal bowel movement recently. Has had no melena or hematochezia. Nursing staff now reports that some of his most recent blood pressure readings have been erroneously high due to cuff placement.   Past Medical History  Diagnosis Date  . Hypertension   . Diabetes mellitus    previous stroke with residual left leg weakness. Walks with a walker. Gastroesophageal reflux disease Reported history of coronary artery disease Depression Anxiety Previous history of polysubstance abuse including alcohol, has been sober for many years. Obstructive sleep apnea on CPAP  Social History: Smokes a half a pack of cigarettes a day. Lives with his wife. 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 Denies current alcohol or drug use  Allergies  Allergen Reactions  . Depakote (Divalproex Sodium)   . Ivp Dye (Iodinated Diagnostic Agents)   . Keflex (Cephalexin)   . Other     Seroquil    Family history significant for Alzheimer's dementia, diabetes, coronary artery disease  Prior to Admission medications   Medication Sig Start Date End Date Taking? Authorizing Provider  busPIRone (BUSPAR) 10 MG tablet Take 10 mg by mouth 3 (three) times daily.  Yes Historical Provider, MD  carbamazepine (TEGRETOL) 200 MG tablet Take 400 mg by mouth 2 (two) times daily.   Yes Historical Provider, MD  clonazePAM (KLONOPIN) 2 MG tablet Take 2 mg by mouth at bedtime.   Yes Historical Provider, MD  clopidogrel (PLAVIX) 75 MG tablet Take 75 mg by mouth daily.   Yes Historical Provider, MD  Dextromethorphan-Quinidine (NUEDEXTA)  20-10 MG CAPS Take 1 capsule by mouth daily.   Yes Historical Provider, MD  gabapentin (NEURONTIN) 300 MG capsule Take 600 mg by mouth 3 (three) times daily.   Yes Historical Provider, MD  hydrALAZINE (APRESOLINE) 100 MG tablet Take 100 mg by mouth 3 (three) times daily.   Yes Historical Provider, MD  labetalol (NORMODYNE) 200 MG tablet Take 200 mg by mouth 3 (three) times daily.   Yes Historical Provider, MD  lamoTRIgine (LAMICTAL) 200 MG tablet Take 200 mg by mouth 2 (two) times daily.   Yes Historical Provider, MD  LANCETS ULTRA THIN 99991111 MISC 1 application by Does not apply route daily.   Yes Historical Provider, MD  nitroGLYCERIN (NITROSTAT) 0.4 MG SL tablet Place 0.4 mg under the tongue as needed for chest pain.   Yes Historical Provider, MD  oxyCODONE-acetaminophen (PERCOCET/ROXICET) 5-325 MG per tablet Take 1 tablet by mouth every 8 (eight) hours as needed for pain.   Yes Historical Provider, MD  pantoprazole (PROTONIX) 40 MG tablet Take 40 mg by mouth daily.   Yes Historical Provider, MD  tolterodine (DETROL) 2 MG tablet Take 2 mg by mouth every 12 (twelve) hours.   Yes Historical Provider, MD  topiramate (TOPAMAX) 50 MG tablet Take 50 mg by mouth 2 (two) times daily.   Yes Historical Provider, MD  Glucose Blood (ACCU-CHEK AVIVA VI) 1 strip by In Vitro route every morning.    Historical Provider, MD  rizatriptan (MAXALT-MLT) 10 MG disintegrating tablet Take 10 mg by mouth as needed for migraine. May repeat in 2 hours if needed. Maximum 3 tablets in 24 hours.    Historical Provider, MD  Metformin, dose unknown  Physical Exam: Filed Vitals:   05/28/12 1645 05/28/12 1700 05/28/12 1730 05/28/12 1731  BP:  155/124 189/136 125/65  Pulse: 70 84 82 99  Temp:      TempSrc:      Resp:    20  Height:      Weight:      SpO2: 97% 96% 97% 100%   BP 125/65  Pulse 99  Temp(Src) 98.1 F (36.7 C) (Oral)  Resp 20  Ht 5\' 4"  (1.626 m)  Wt 116.121 kg (256 lb)  BMI 43.92 kg/m2  SpO2  100%  General Appearance:    Alert, cooperative, no distress, appears stated age.obese. Very talkative.   Head:    Normocephalic, without obvious abnormality, atraumatic  Eyes:    PERRL, conjunctiva/corneas clear, EOM's intact, fundi    benign, both eyes          Nose:   Nares normal, septum midline, mucosa normal, no drainage   or sinus tenderness  Throat:   Lips, mucosa, and tongue normal; teeth and gums normal  Neck:   Supple, symmetrical, trachea midline, no adenopathy;       thyroid:  No enlargement/tenderness/nodules; no carotid   bruit or JVD  Back:     Symmetric, no curvature, ROM normal, no CVA tenderness  Lungs:     Clear to auscultation bilaterally, respirations unlabored  Chest wall:    No tenderness or deformity  Heart:  Regular rate and rhythm, S1 and S2 normal, no murmur, rub   or gallop  Abdomen:     obese, soft, normal bowel sounds, nontender   Genitalia:   deferred  Rectal:   deferred  Extremities:   Extremities normal, atraumatic, no cyanosis 1+ pitting edema bilaterally   Pulses:   diminished pedal pulses   Skin:   Skin color, texture, turgor normal, no rashes or lesions  Lymph nodes:   Cervical, supraclavicular, and axillary nodes normal  Neurologic:   CNII-XII intact. alert, oriented. Left leg weakness is old     Psychiatric:  cooperative. Very talkative.  Labs on Admission:  Basic Metabolic Panel:  Recent Labs Lab 05/28/12 1349  NA 141  K 3.0*  CL 106  CO2 21  GLUCOSE 108*  BUN 14  CREATININE 1.19  CALCIUM 10.3   Liver Function Tests: No results found for this basename: AST, ALT, ALKPHOS, BILITOT, PROT, ALBUMIN,  in the last 168 hours No results found for this basename: LIPASE, AMYLASE,  in the last 168 hours No results found for this basename: AMMONIA,  in the last 168 hours CBC:  Recent Labs Lab 05/28/12 1349  WBC 6.4  HGB 15.4  HCT 42.8  MCV 84.6  PLT 261   Cardiac Enzymes: No results found for this basename: CKTOTAL, CKMB,  CKMBINDEX, TROPONINI,  in the last 168 hours  BNP (last 3 results) No results found for this basename: PROBNP,  in the last 8760 hours CBG: No results found for this basename: GLUCAP,  in the last 168 hours  Radiological Exams on Admission: Dg Chest Portable 1 View  05/28/2012   *RADIOLOGY REPORT*  Clinical Data: Hematemesis.  Chest pain.  Shortness breath.  PORTABLE CHEST - 1 VIEW  Comparison: 10/02/2010  Findings: Chronic right mid thoracic rib deformity.  Otherwise unremarkable exam.  IMPRESSION:  1.  No acute findings.   Original Report Authenticated By: Van Clines, M.D.    EKG: NSR, with rightward axis. Age indeterminate septal infarct.  Assessment/Plan    Precordial chest pain: unfortunately, patient is unclear about specifics of previous ischemia workup. Will ask for records from [facility]. Patient does have a history of heart disease. No aspirin or Lovenox due to hematemesis. Will cycle cardiac enzymes and consult cardiology for recommendations. Because patient is currently on a Cardene drip, will need to go to step down. Hopefully, we can wean this off soon. Patient is still having chest pain. Have asked nursing staff to give patient nitroglycerin sublingual. Will also order nitro paste.     Malignant hypertension: Patient reports that his blood pressure always runs high, but not to this extent. If patient is able to tolerate anything by mouth, will resume home medications and wean off Cardene drip.     CAD (coronary artery disease): See above. Will check LDL.     Hematemesis: Hemoglobin is normal and blood pressure is not low. Will consult GI for eventual endoscopy once cleared by cardiology. Patient has been typed and screened. Will get hemoglobins every 8 hours x3 continue protonix.  will order 40 mg IV twice a day. Patient has no history of same. He reports that his previous EGD was for gastroesophageal reflux disease. Will check liver function tests and lipase     Headache: May be related to malignant hypertension. Will however get CT brain to rule out bleed or other intracranial pathology. He can have Dilaudid as needed.     DM type 2 (diabetes mellitus, type  2): Home medication regimen will need to be clarified. We'll monitor blood glucose and give sliding scale insulin for now. Check hemoglobin A1c.    Hypokalemia: Replete IV    GERD (gastroesophageal reflux disease)    History of stroke with residual left sided weakness, stable. Hold Plavix in the setting of upper GI bleed    Depression: Continue outpatient medications if patient is able to take anything by mouth    Anxiety: continue outpatient meds    Obesity  Obstructive sleep apnea: Continue CPAP each bedtime.    Tobacco abuse, counselled against. nicoderm patch  Code Status: full code Family Communication: none Disposition Plan: eventually, home  Time spent: 90 minutes  [facility] Pager [phone]  If 7PM-7AM, please contact night-coverage [email or web address] Password [facility] 05/28/2012, 5:49 PM  Discussed with Dr. Ron Parker, who will consult  Discussed with Dr. Benson Norway who will consult tomorrow am  While following up studies, I noticed patient is in 16-Apr-2098. I ordered stepdown, but apparently he is in 04/16/2098 because of the Cardene drip. Discussed with Dr. Melvyn Novas. [facility] okay to continue as the attending physician, rather than transfer services to critical care. CT head shows no acute intracranial pathology.

## 2012-05-28 NOTE — ED Notes (Signed)
Pt states he started having chest pain at 10:00 this morning, three hours ago. Pt states he has vomited three times since the pain has started. Pt states that he has numbness down his left arm and pain down his left arm. Pt states that he uses nitroglycerin at home. Pt states he smokes half a pack of cigarettes a day. Pt states he has had an angioplasty and has been to the cardiac cath lab before.

## 2012-05-28 NOTE — Consult Note (Signed)
CARDIOLOGY CONSULT NOTE  Patient ID: Austin Shepard MRN: [id number] DOB/AGE: 49-Feb-1967 49 y.o.  Admit date: 05/28/2012  Primary 22 primary provider on file. Primary Cardiologist   [place] Reason for Consultation  HPI:    The patient presented to the emergency room with chest pain. He then said that he had a severe headache. He had severe hypertension. This is being treated with IV meds. He then vomited and had an episode of hematemesis. There is a history of cardiac disease. His first troponin was normal. We were asked help with his overall assessment. It turns out from reviewing the record that the patient actually had a similar admission to this hospital and September, 2012. He presented with high blood pressure and a headache. Ultimately he stabilized. No formal cardiac workup was done. It was felt that his pain was atypical.  There is a history of the patient having an MI in 1998 and 2000. He says that he did undergo cardiac catheterization. He says that he has had some nuclear stress tests in [place] over the years.  In the emergency room there was no acute EKG change. His first troponin is normal.  It is also recorded in the consult note done in 2012 the patient has a history of alcohol and drug abuse. 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     Past Medical History  Diagnosis Date  . Hypertension   . Diabetes mellitus     No family history on file.  History   Social History  . Marital Status: Single    Spouse Name: N/A    Number of Children: N/A  . Years of Education: N/A   Occupational History  . Not on file.   Social History Main Topics  . Smoking status: Current Every Day Smoker -- 0.50 packs/day  . Smokeless tobacco: Not on file  . Alcohol Use: Not on file  . Drug Use: Not on file  . Sexually Active: Not on file   Other Topics Concern  . Not on file   Social History Narrative  . No narrative on file    History reviewed. No pertinent past surgical history.   Prescriptions prior to admission  Medication Sig Dispense Refill  . busPIRone (BUSPAR) 10 MG tablet Take 10 mg by mouth 3 (three) times daily.      . carbamazepine (TEGRETOL) 200 MG tablet Take 400 mg by mouth 2 (two) times daily.      . clonazePAM (KLONOPIN) 2 MG tablet Take 2 mg by mouth at bedtime.      . clopidogrel (PLAVIX) 75 MG tablet Take 75 mg by mouth daily.      Marland Kitchen Dextromethorphan-Quinidine (NUEDEXTA) 20-10 MG CAPS Take 1 capsule by mouth daily.      Marland Kitchen gabapentin (NEURONTIN) 300 MG capsule Take 600 mg by mouth 3 (three) times daily.      . hydrALAZINE (APRESOLINE) 100 MG tablet Take 100 mg by mouth 3 (three) times daily.      Marland Kitchen labetalol (NORMODYNE) 200 MG tablet Take 200 mg by mouth 3 (three) times daily.      Marland Kitchen lamoTRIgine (LAMICTAL) 200 MG tablet Take 200 mg by mouth 2 (two) times daily.      Marland Kitchen LANCETS ULTRA THIN 99991111 MISC 1 application by Does not apply route daily.      . nitroGLYCERIN (NITROSTAT) 0.4 MG SL tablet Place 0.4 mg under the tongue as needed for chest pain.      Marland Kitchen  oxyCODONE-acetaminophen (PERCOCET/ROXICET) 5-325 MG per tablet Take 1 tablet by mouth every 8 (eight) hours as needed for pain.      . pantoprazole (PROTONIX) 40 MG tablet Take 40 mg by mouth daily.      Marland Kitchen tolterodine (DETROL) 2 MG tablet Take 2 mg by mouth every 12 (twelve) hours.      . topiramate (TOPAMAX) 50 MG tablet Take 50 mg by mouth 2 (two) times daily.      . Glucose Blood (ACCU-CHEK AVIVA VI) 1 strip by In Vitro route every morning.      . rizatriptan (MAXALT-MLT) 10 MG disintegrating tablet Take 10 mg by mouth as needed for migraine. May repeat in 2 hours if needed. Maximum 3 tablets in 24 hours.       Review of systems:  He has a headache. He denies fever, chills, headache, sweats, rash, change in vision, change in hearing, cough, urinary symptoms. All other systems are reviewed and are negative.  Physical Exam: Blood pressure  125/65, pulse 99, temperature 98.1 F (36.7 C), temperature source Oral, resp. rate 20, height 5\' 4"  (1.626 m), weight 256 lb (116.121 kg), SpO2 100.00%.   Patient says he has a headache. He has just had a head CT. This is being followed by internal medicine. He is oriented to person time and place. Affect is normal. There is no jugulovenous distention. Lungs are clear. Respiratory effort is nonlabored. Cardiac exam reveals S1-S2. There are no clicks or significant murmurs. The abdomen is soft. There is no significant peripheral edema. There no musculoskeletal deformities. There are no skin rashes.   Labs:   Lab Results  Component Value Date   WBC 6.4 05/28/2012   HGB 15.4 05/28/2012   HCT 42.8 05/28/2012   MCV 84.6 05/28/2012   PLT 261 05/28/2012    Recent Labs Lab 05/28/12 1349 05/28/12 1733  NA 141  --   K 3.0*  --   CL 106  --   CO2 21  --   BUN 14  --   CREATININE 1.19  --   CALCIUM 10.3  --   PROT  --  7.3  BILITOT  --  0.3  ALKPHOS  --  94  ALT  --  20  AST  --  22  GLUCOSE 108*  --    Lab Results  Component Value Date   CKTOTAL 61 10/02/2010   CKMB 2.9 10/02/2010   TROPONINI <0.30 10/02/2010    Lab Results  Component Value Date   CHOL 146 10/03/2010   Lab Results  Component Value Date   HDL 19* 10/03/2010   Lab Results  Component Value Date   LDLCALC UNABLE TO CALCULATE IF TRIGLYCERIDE OVER 400 mg/dL 10/03/2010   Lab Results  Component Value Date   TRIG 517* 10/03/2010   Lab Results  Component Value Date   CHOLHDL 7.7 10/03/2010   No results found for this basename: LDLDIRECT    BNP (last 3 results) No results found for this basename: PROBNP,  in the last 8760 hours    Radiology: Ct Head Wo Contrast  05/28/2012   *RADIOLOGY REPORT*  Clinical Data: Severe headache.  CT HEAD WITHOUT CONTRAST  Technique:  Contiguous axial images were obtained from the base of the skull through the vertex without contrast.  Comparison: Head CT scan 10/03/2010.  Findings: The  brain appears normal without infarct, hemorrhage, mass lesion, mass effect, midline shift or abnormal extra-axial fluid collection.  The patient has chronic left maxillary  sinus disease with wall thickening and complete opacification of the visualized sinus.  The calvarium is intact.  IMPRESSION:  1.  No acute finding. 2.  Chronic left maxillary sinus disease.   Original Report Authenticated By: Orlean Patten, M.D.   Dg Chest Portable 1 View  05/28/2012   *RADIOLOGY REPORT*  Clinical Data: Hematemesis.  Chest pain.  Shortness breath.  PORTABLE CHEST - 1 VIEW  Comparison: 10/02/2010  Findings: Chronic right mid thoracic rib deformity.  Otherwise unremarkable exam.  IMPRESSION:  1.  No acute findings.   Original Report Authenticated By: Van Clines, M.D.   EKG:  I have personally reviewed the EKG. There is normal sinus rhythm. There are no diagnostic abnormalities.  ASSESSMENT AND PLAN:     Precordial chest pain      At this point I am not convinced that his pain is related to ischemia. We'll watch his enzymes.    Malignant hypertension      Patient had severe hypertension in the ER. However he has had a very quick response to IV medications.    CAD (coronary artery disease)      There is a history of coronary disease. However we have no documentation. This needs to be obtained from his doctors in [facility].    Hematemesis      Patient had hematemesis in the emergency room and this will be followed and treated by the internal medicine team.    GERD (gastroesophageal reflux disease)   History of stroke with residual left sided weakness   Depression   Anxiety    Headache      His headache is concerning. However I now see the CT scan. He has no acute abnormality on his scan of his head.    DM type 2 (diabetes mellitus, type 2)   Obesity   Tobacco abuse   Hypokalemia    History of drug abuse in the past including alcohol, methamphetamines, marijuana, pain meds.    He will have  to be watched very carefully during this hospital stay.   The patient's presentation is quite unusual. I am not convinced that any of his symptoms are cardiac in origin. Certainly his hematemesis will have to be fully evaluated. We will follow his cardiac status.  Daryel November, MD  ,

## 2012-05-29 ENCOUNTER — Encounter (HOSPITAL_COMMUNITY)
Admission: EM | Disposition: A | Discharge status: Home / Self Care | Payer: Self-pay | Source: Home / Self Care | Attending: Internal Medicine

## 2012-05-29 ENCOUNTER — Encounter (HOSPITAL_COMMUNITY): Payer: Self-pay | Admitting: *Deleted

## 2012-05-29 DIAGNOSIS — I2510 Atherosclerotic heart disease of native coronary artery without angina pectoris: Principal | ICD-10-CM

## 2012-05-29 HISTORY — PX: ESOPHAGOGASTRODUODENOSCOPY: SHX5428

## 2012-05-29 LAB — BASIC METABOLIC PANEL
CO2: 16 mEq/L — ABNORMAL LOW (ref 19–32)
Calcium: 9.3 mg/dL (ref 8.4–10.5)
Creatinine, Ser: 1.33 mg/dL (ref 0.50–1.35)
GFR calc Af Amer: 72 mL/min — ABNORMAL LOW (ref >90)
Sodium: 138 mEq/L (ref 135–145)

## 2012-05-29 LAB — GLUCOSE, CAPILLARY
Glucose-Capillary: 98 mg/dL (ref 70–99)
Glucose-Capillary: 146 mg/dL — ABNORMAL HIGH (ref 70–99)
Glucose-Capillary: 120 mg/dL — ABNORMAL HIGH (ref 70–99)

## 2012-05-29 LAB — LIPID PANEL
HDL: 23 mg/dL — ABNORMAL LOW (ref >39)
LDL Cholesterol: 59 mg/dL (ref 0–99)
Triglycerides: 210 mg/dL — ABNORMAL HIGH (ref <150)
VLDL: 42 mg/dL — ABNORMAL HIGH (ref 0–40)

## 2012-05-29 LAB — TSH: TSH: 1.243 u[IU]/mL (ref 0.350–4.500)

## 2012-05-29 LAB — HEMOGLOBIN A1C: Hgb A1c MFr Bld: 5.4 % (ref <5.7)

## 2012-05-29 LAB — HEMOGLOBIN AND HEMATOCRIT, BLOOD: HCT: 41.7 % (ref 39.0–52.0)

## 2012-05-29 LAB — TROPONIN I: Troponin I: <0.3 ng/mL (ref <0.30)

## 2012-05-29 SURGERY — EGD (ESOPHAGOGASTRODUODENOSCOPY)
Anesthesia: Moderate Sedation

## 2012-05-29 MED ORDER — FENTANYL CITRATE 0.05 MG/ML IJ SOLN
INTRAMUSCULAR | Status: DC | PRN
  Administered 2012-05-29: 12.5 ug via INTRAVENOUS
  Administered 2012-05-29: 25 ug via INTRAVENOUS

## 2012-05-29 MED ORDER — MIDAZOLAM HCL 5 MG/ML IJ SOLN
INTRAMUSCULAR | Status: AC
  Filled 2012-05-29: qty 2

## 2012-05-29 MED ORDER — FENTANYL CITRATE 0.05 MG/ML IJ SOLN
INTRAMUSCULAR | Status: AC
  Filled 2012-05-29: qty 2

## 2012-05-29 MED ORDER — PANTOPRAZOLE SODIUM 40 MG PO TBEC
40.0000 mg | DELAYED_RELEASE_TABLET | Freq: Two times a day (BID) | ORAL | Status: DC
  Administered 2012-05-29 – 2012-05-30 (×3): 40 mg via ORAL
  Filled 2012-05-29 (×3): qty 1

## 2012-05-29 MED ORDER — OXYCODONE HCL 5 MG PO TABS
5.0000 mg | ORAL_TABLET | Freq: Four times a day (QID) | ORAL | Status: DC | PRN
  Administered 2012-05-29: 10 mg via ORAL
  Administered 2012-05-29: 5 mg via ORAL
  Administered 2012-05-30 (×2): 10 mg via ORAL
  Filled 2012-05-29 (×3): qty 2
  Filled 2012-05-29: qty 1

## 2012-05-29 MED ORDER — SODIUM CHLORIDE 0.9 % IV SOLN
INTRAVENOUS | Status: DC
  Administered 2012-05-29: 500 mL via INTRAVENOUS

## 2012-05-29 MED ORDER — CLONIDINE HCL 0.1 MG PO TABS
0.1000 mg | ORAL_TABLET | Freq: Once | ORAL | Status: AC
  Administered 2012-05-29: 0.1 mg via ORAL
  Filled 2012-05-29 (×2): qty 1

## 2012-05-29 MED ORDER — DIPHENHYDRAMINE HCL 50 MG/ML IJ SOLN
INTRAMUSCULAR | Status: AC
  Filled 2012-05-29: qty 1

## 2012-05-29 MED ORDER — MORPHINE SULFATE 2 MG/ML IJ SOLN
1.0000 mg | INTRAMUSCULAR | Status: DC | PRN
  Administered 2012-05-29 – 2012-05-30 (×2): 2 mg via INTRAVENOUS
  Filled 2012-05-29 (×2): qty 1

## 2012-05-29 MED ORDER — HYDRALAZINE HCL 50 MG PO TABS
50.0000 mg | ORAL_TABLET | Freq: Three times a day (TID) | ORAL | Status: DC
  Administered 2012-05-29 – 2012-05-30 (×2): 50 mg via ORAL
  Filled 2012-05-29 (×4): qty 1

## 2012-05-29 MED ORDER — MIDAZOLAM HCL 10 MG/2ML IJ SOLN
INTRAMUSCULAR | Status: DC | PRN
  Administered 2012-05-29 (×2): 2 mg via INTRAVENOUS

## 2012-05-29 MED ORDER — LABETALOL HCL 200 MG PO TABS
200.0000 mg | ORAL_TABLET | Freq: Two times a day (BID) | ORAL | Status: DC
  Administered 2012-05-30: 200 mg via ORAL
  Filled 2012-05-29 (×2): qty 1

## 2012-05-29 NOTE — Progress Notes (Signed)
Patient's wife called. She says that patient sees MD Hunt Oris [phone] for phychiatric issues and to call MD Chivane before any changes are made in phychiatric medications.  Austin Shepard

## 2012-05-29 NOTE — Op Note (Signed)
[facility] [place], [id number]   OPERATIVE PROCEDURE REPORT  PATIENT: Austin, Shepard  MR#: [id number] BIRTHDATE: 12/28/1965  GENDER: Male ENDOSCOPIST: Carol Ada, MD ASSISTANT:   Cherylynn Ridges, technician and Rickard Rhymes, RN PROCEDURE DATE: 05/29/2012 PROCEDURE:   EGD, diagnostic ASA CLASS:   Class III INDICATIONS:Hematemesis. MEDICATIONS: Versed 4 mg IV and Fentanyl 37.5 mcg IV TOPICAL ANESTHETIC:   none  DESCRIPTION OF PROCEDURE:   After the risks benefits and alternatives of the procedure were thoroughly explained, informed consent was obtained.  The Pentax Gastroscope [id number]  endoscope was introduced through the mouth  and advanced to the second portion of the duodenum Without limitations.      The instrument was slowly withdrawn as the mucosa was fully examined.    FINDINGS: The upper, middle and distal third of the esophagus were carefully inspected and no abnormalities were noted.  The z-line was well seen at the GEJ with a sharp demarcation.  There may be evidence of a mild irritation, but no overt esophagitis.  The endoscope was pushed into the fundus which was normal including a retroflexed view.  The antrum, gastric body, first and second part of the duodenum were unremarkable.   Retroflexed views revealed no abnormalities.     The scope was then withdrawn from the patient and the procedure terminated.  COMPLICATIONS: There were no complications. IMPRESSION: 1) Normal EGD  RECOMMENDATIONS: 1) No further GI evaluation.  Continue with PPI for his complaints of GERD. 2) Signing off.   _______________________________ Lorrin MaisCarol Ada, MD 05/29/2012 1:46 PM

## 2012-05-29 NOTE — Consult Note (Signed)
Reason for Consult: Hematemesis Referring Physician: [facility]  [name] HPI: This is a 47 year old male with malignant HTN admitted for complaints of chest pain and headache.  During his work up he was identified to have a BP of 209/171. In the past he sought care from [facility] with his hypertension.  Upon his transfer to the ICU he had one bought of hematemesis.  It was witnessed, but his HGB did not drop.  No prior history of hematemesis and he denied any issues with nausea or vomiting before this event.  He reports having issues with GERD in the past.  Currently he has been ruled out for MI by Cardiology and Dr. Ron Parker does not feel his chest pain is from a cardiac source.  Past Medical History  Diagnosis Date  . Hypertension   . Diabetes mellitus   . Myocardial infarction   . Stroke   . Depression   . Anxiety     History reviewed. No pertinent past surgical history.  History reviewed. No pertinent family history.  Social History:  reports that he has been smoking.  He does not have any smokeless tobacco history on file. His alcohol and drug histories are not on file.  Allergies:  Allergies  Allergen Reactions  . Depakote (Divalproex Sodium)   . Ivp Dye (Iodinated Diagnostic Agents)   . Keflex (Cephalexin)   . Other     Seroquil     Medications:  Scheduled: . busPIRone  10 mg Oral TID  . carbamazepine  400 mg Oral BID  . clonazePAM  2 mg Oral QHS  . gabapentin  600 mg Oral TID  . hydrALAZINE  100 mg Oral TID  . insulin aspart  0-9 Units Subcutaneous TID WC  . labetalol  200 mg Oral TID  . lamoTRIgine  200 mg Oral BID  . nicotine  7 mg Transdermal Daily  . nitroGLYCERIN  1 inch Topical Q6H  . oxybutynin  5 mg Oral QHS  . pantoprazole  40 mg Oral BID  . sodium chloride  3 mL Intravenous Q12H  . topiramate  50 mg Oral BID   Continuous: . sodium chloride 1,000 mL (05/28/12 1943)    Results for orders placed during the hospital  encounter of 05/28/12 (from the past 24 hour(s))  TYPE AND SCREEN     Status: None   Collection Time    05/28/12  1:45 PM      Result Value Range   ABO/RH(D) O POS     Antibody Screen NEG     Sample Expiration 05/31/2012    ABO/RH     Status: None   Collection Time    05/28/12  1:45 PM      Result Value Range   ABO/RH(D) O POS    BASIC METABOLIC PANEL     Status: Abnormal   Collection Time    05/28/12  1:49 PM      Result Value Range   Sodium 141  135 - 145 mEq/L   Potassium 3.0 (*) 3.5 - 5.1 mEq/L   Chloride 106  96 - 112 mEq/L   CO2 21  19 - 32 mEq/L   Glucose, Bld 108 (*) 70 - 99 mg/dL   BUN 14  6 - 23 mg/dL   Creatinine, Ser 1.19  0.50 - 1.35 mg/dL   Calcium 10.3  8.4 - 10.5 mg/dL   GFR calc non Af Amer 71 (*) >90 mL/min   GFR calc  Af Amer 82 (*) >90 mL/min  CBC     Status: None   Collection Time    05/28/12  1:49 PM      Result Value Range   WBC 6.4  4.0 - 10.5 K/uL   RBC 5.06  4.22 - 5.81 MIL/uL   Hemoglobin 15.4  13.0 - 17.0 g/dL   HCT 42.8  39.0 - 52.0 %   MCV 84.6  78.0 - 100.0 fL   MCH 30.4  26.0 - 34.0 pg   MCHC 36.0  30.0 - 36.0 g/dL   RDW 13.2  11.5 - 15.5 %   Platelets 261  150 - 400 K/uL  MAGNESIUM     Status: None   Collection Time    05/28/12  1:49 PM      Result Value Range   Magnesium 1.9  1.5 - 2.5 mg/dL  POCT I-STAT TROPONIN I     Status: None   Collection Time    05/28/12  2:01 PM      Result Value Range   Troponin i, poc 0.00  0.00 - 0.08 ng/mL   Comment 3           HEPATIC FUNCTION PANEL     Status: None   Collection Time    05/28/12  5:33 PM      Result Value Range   Total Protein 7.3  6.0 - 8.3 g/dL   Albumin 4.1  3.5 - 5.2 g/dL   AST 22  0 - 37 U/L   ALT 20  0 - 53 U/L   Alkaline Phosphatase 94  39 - 117 U/L   Total Bilirubin 0.3  0.3 - 1.2 mg/dL   Bilirubin, Direct <0.1  0.0 - 0.3 mg/dL   Indirect Bilirubin NOT CALCULATED  0.3 - 0.9 mg/dL  LIPASE, BLOOD     Status: None   Collection Time    05/28/12  5:33 PM      Result  Value Range   Lipase 38  11 - 59 U/L  MRSA PCR SCREENING     Status: None   Collection Time    05/28/12  7:26 PM      Result Value Range   MRSA by PCR NEGATIVE  NEGATIVE  GLUCOSE, CAPILLARY     Status: Abnormal   Collection Time    05/28/12  7:54 PM      Result Value Range   Glucose-Capillary 124 (*) 70 - 99 mg/dL   Comment 1 Notify RN    TROPONIN I     Status: None   Collection Time    05/28/12  9:09 PM      Result Value Range   Troponin I <0.30  <0.30 ng/mL  TSH     Status: None   Collection Time    05/28/12  9:09 PM      Result Value Range   TSH 1.243  0.350 - 4.500 uIU/mL  HEMOGLOBIN AND HEMATOCRIT, BLOOD     Status: None   Collection Time    05/28/12  9:09 PM      Result Value Range   Hemoglobin 14.7  13.0 - 17.0 g/dL   HCT 41.0  39.0 - 52.0 %  LIPID PANEL     Status: Abnormal   Collection Time    05/29/12  2:15 AM      Result Value Range   Cholesterol 124  0 - 200 mg/dL   Triglycerides 210 (*) <150 mg/dL   HDL 23 (*) >  39 mg/dL   Total CHOL/HDL Ratio 5.4     VLDL 42 (*) 0 - 40 mg/dL   LDL Cholesterol 59  0 - 99 mg/dL  BASIC METABOLIC PANEL     Status: Abnormal   Collection Time    05/29/12  2:15 AM      Result Value Range   Sodium 138  135 - 145 mEq/L   Potassium 3.8  3.5 - 5.1 mEq/L   Chloride 104  96 - 112 mEq/L   CO2 16 (*) 19 - 32 mEq/L   Glucose, Bld 95  70 - 99 mg/dL   BUN 16  6 - 23 mg/dL   Creatinine, Ser 1.33  0.50 - 1.35 mg/dL   Calcium 9.3  8.4 - 10.5 mg/dL   GFR calc non Af Amer 62 (*) >90 mL/min   GFR calc Af Amer 72 (*) >90 mL/min  TROPONIN I     Status: None   Collection Time    05/29/12  2:24 AM      Result Value Range   Troponin I <0.30  <0.30 ng/mL  HEMOGLOBIN AND HEMATOCRIT, BLOOD     Status: None   Collection Time    05/29/12  2:24 AM      Result Value Range   Hemoglobin 14.5  13.0 - 17.0 g/dL   HCT 41.7  39.0 - 52.0 %  GLUCOSE, CAPILLARY     Status: None   Collection Time    05/29/12  7:54 AM      Result Value Range    Glucose-Capillary 98  70 - 99 mg/dL     Ct Head Wo Contrast  05/28/2012   *RADIOLOGY REPORT*  Clinical Data: Severe headache.  CT HEAD WITHOUT CONTRAST  Technique:  Contiguous axial images were obtained from the base of the skull through the vertex without contrast.  Comparison: Head CT scan 10/03/2010.  Findings: The brain appears normal without infarct, hemorrhage, mass lesion, mass effect, midline shift or abnormal extra-axial fluid collection.  The patient has chronic left maxillary sinus disease with wall thickening and complete opacification of the visualized sinus.  The calvarium is intact.  IMPRESSION:  1.  No acute finding. 2.  Chronic left maxillary sinus disease.   Original Report Authenticated By: Orlean Patten, M.D.   Dg Chest Portable 1 View  05/28/2012   *RADIOLOGY REPORT*  Clinical Data: Hematemesis.  Chest pain.  Shortness breath.  PORTABLE CHEST - 1 VIEW  Comparison: 10/02/2010  Findings: Chronic right mid thoracic rib deformity.  Otherwise unremarkable exam.  IMPRESSION:  1.  No acute findings.   Original Report Authenticated By: Van Clines, M.D.    ROS:  As stated above in the HPI otherwise negative.  Blood pressure 130/95, pulse 70, temperature 98.1 F (36.7 C), temperature source Oral, resp. rate 13, height 5\' 4"  (1.626 m), weight 256 lb (116.121 kg), SpO2 96.00%.    PE: Gen: NAD, Alert and Oriented HEENT:  [place]/AT, EOMI Neck: Supple, no LAD Lungs: CTA Bilaterally CV: RRR without M/G/R ABM: Soft, NTND, +BS Ext: No C/C/E  Assessment/Plan: 1) Hematemesis. 2) GERD. 3) OSA. 4) Malignant HTN - under control   His presentation is consistent with a  Mallory-Weiss tear, however, an EGD is necessary for further evaluation.  He has a history of GERD and he may also have an esophagitis.  Currently his HTN is under control.  Plan: 1) EGD today.  Sarvesh Meddaugh D 05/29/2012, 8:51 AM

## 2012-05-29 NOTE — Progress Notes (Signed)
Patient ID: Austin Shepard, male   DOB: [birth year], 47 y.o.   MRN: [id number]   SUBJECTIVE: Patient is stable today. Troponins are normal. Followup EKG reveals no diagnostic changes. I feel that he did not have any type of ischemic chest pain.   Filed Vitals:   05/29/12 0350 05/29/12 0400 05/29/12 0505 05/29/12 0600  BP:  141/103 178/112 143/79  Pulse:  62 75 71  Temp: 97.9 F (36.6 C)     TempSrc: Oral     Resp:  15 19 16   Height:      Weight:      SpO2:  95% 96% 90%    Intake/Output Summary (Last 24 hours) at 05/29/12 0752 Last data filed at 05/29/12 0600  Gross per 24 hour  Intake    340 ml  Output    875 ml  Net   -535 ml    LABS: Basic Metabolic Panel:  Recent Labs  05/28/12 1349 05/29/12 0215  NA 141 138  K 3.0* 3.8  CL 106 104  CO2 21 16*  GLUCOSE 108* 95  BUN 14 16  CREATININE 1.19 1.33  CALCIUM 10.3 9.3  MG 1.9  --    Liver Function Tests:  Recent Labs  05/28/12 1733  AST 22  ALT 20  ALKPHOS 94  BILITOT 0.3  PROT 7.3  ALBUMIN 4.1    Recent Labs  05/28/12 1733  LIPASE 38   CBC:  Recent Labs  05/28/12 1349 05/28/12 2109 05/29/12 0224  WBC 6.4  --   --   HGB 15.4 14.7 14.5  HCT 42.8 41.0 41.7  MCV 84.6  --   --   PLT 261  --   --    Cardiac Enzymes:  Recent Labs  05/28/12 2109 05/29/12 0224  TROPONINI <0.30 <0.30   BNP: No components found with this basename: POCBNP,  D-Dimer: No results found for this basename: DDIMER,  in the last 72 hours Hemoglobin A1C: No results found for this basename: HGBA1C,  in the last 72 hours Fasting Lipid Panel:  Recent Labs  05/29/12 0215  CHOL 124  HDL 23*  LDLCALC 59  TRIG 210*  CHOLHDL 5.4   Thyroid Function Tests:  Recent Labs  05/28/12 2109  TSH 1.243    RADIOLOGY: Ct Head Wo Contrast  05/28/2012   *RADIOLOGY REPORT*  Clinical Data: Severe headache.  CT HEAD WITHOUT CONTRAST  Technique:  Contiguous axial images were obtained from the base of the skull through the  vertex without contrast.  Comparison: Head CT scan 10/03/2010.  Findings: The brain appears normal without infarct, hemorrhage, mass lesion, mass effect, midline shift or abnormal extra-axial fluid collection.  The patient has chronic left maxillary sinus disease with wall thickening and complete opacification of the visualized sinus.  The calvarium is intact.  IMPRESSION:  1.  No acute finding. 2.  Chronic left maxillary sinus disease.   Original Report Authenticated By: Orlean Patten, M.D.   Dg Chest Portable 1 View  05/28/2012   *RADIOLOGY REPORT*  Clinical Data: Hematemesis.  Chest pain.  Shortness breath.  PORTABLE CHEST - 1 VIEW  Comparison: 10/02/2010  Findings: Chronic right mid thoracic rib deformity.  Otherwise unremarkable exam.  IMPRESSION:  1.  No acute findings.   Original Report Authenticated By: Van Clines, M.D.    PHYSICAL EXAM  Patient is oriented to person time and place. He is stable.   TELEMETRY: I have reviewed telemetry today May 29, 2012. There is normal sinus rhythm.  ASSESSMENT AND PLAN:    Precordial chest pain      Patient had some chest pain as part of his presenting complex of symptoms yesterday. There is no evidence that this pain was cardiac. No further workup is recommended. Cardiology will sign off. The patient has cardiologists in [facility].      Malignant hypertension   CAD (coronary artery disease)   Hematemesis   GERD (gastroesophageal reflux disease)   History of stroke with residual left sided weakness   Depression   Anxiety   Headache   DM type 2 (diabetes mellitus, type 2)   Obesity   Tobacco abuse   Hypokalemia   History of drug abuse   Dola Argyle 05/29/2012 7:52 AM

## 2012-05-29 NOTE — Interval H&P Note (Signed)
History and Physical Interval Note:  05/29/2012 1:27 PM  Austin Shepard  has presented today for surgery, with the diagnosis of Hematemesis  The various methods of treatment have been discussed with the patient and family. After consideration of risks, benefits and other options for treatment, the patient has consented to  Procedure(s): ESOPHAGOGASTRODUODENOSCOPY (EGD) (N/A) as a surgical intervention .  The patient's history has been reviewed, patient examined, no change in status, stable for surgery.  I have reviewed the patient's chart and labs.  Questions were answered to the patient's satisfaction.     Asia Dusenbury D

## 2012-05-29 NOTE — Progress Notes (Signed)
Report called into [id number], Estimated transfer time 08:40 Updates given to receiving RN-Plan for GI scope at 2:40 per GI, Patient currently NPO

## 2012-05-29 NOTE — H&P (View-Only) (Signed)
Reason for Consult: Hematemesis Referring Physician: [facility]  [name] HPI: This is a 47 year old male with malignant HTN admitted for complaints of chest pain and headache.  During his work up he was identified to have a BP of 209/171. In the past he sought care from [facility] with his hypertension.  Upon his transfer to the ICU he had one bought of hematemesis.  It was witnessed, but his HGB did not drop.  No prior history of hematemesis and he denied any issues with nausea or vomiting before this event.  He reports having issues with GERD in the past.  Currently he has been ruled out for MI by Cardiology and Dr. Ron Parker does not feel his chest pain is from a cardiac source.  Past Medical History  Diagnosis Date  . Hypertension   . Diabetes mellitus   . Myocardial infarction   . Stroke   . Depression   . Anxiety     History reviewed. No pertinent past surgical history.  History reviewed. No pertinent family history.  Social History:  reports that he has been smoking.  He does not have any smokeless tobacco history on file. His alcohol and drug histories are not on file.  Allergies:  Allergies  Allergen Reactions  . Depakote (Divalproex Sodium)   . Ivp Dye (Iodinated Diagnostic Agents)   . Keflex (Cephalexin)   . Other     Seroquil     Medications:  Scheduled: . busPIRone  10 mg Oral TID  . carbamazepine  400 mg Oral BID  . clonazePAM  2 mg Oral QHS  . gabapentin  600 mg Oral TID  . hydrALAZINE  100 mg Oral TID  . insulin aspart  0-9 Units Subcutaneous TID WC  . labetalol  200 mg Oral TID  . lamoTRIgine  200 mg Oral BID  . nicotine  7 mg Transdermal Daily  . nitroGLYCERIN  1 inch Topical Q6H  . oxybutynin  5 mg Oral QHS  . pantoprazole  40 mg Oral BID  . sodium chloride  3 mL Intravenous Q12H  . topiramate  50 mg Oral BID   Continuous: . sodium chloride 1,000 mL (05/28/12 1943)    Results for orders placed during the hospital  encounter of 05/28/12 (from the past 24 hour(s))  TYPE AND SCREEN     Status: None   Collection Time    05/28/12  1:45 PM      Result Value Range   ABO/RH(D) O POS     Antibody Screen NEG     Sample Expiration 05/31/2012    ABO/RH     Status: None   Collection Time    05/28/12  1:45 PM      Result Value Range   ABO/RH(D) O POS    BASIC METABOLIC PANEL     Status: Abnormal   Collection Time    05/28/12  1:49 PM      Result Value Range   Sodium 141  135 - 145 mEq/L   Potassium 3.0 (*) 3.5 - 5.1 mEq/L   Chloride 106  96 - 112 mEq/L   CO2 21  19 - 32 mEq/L   Glucose, Bld 108 (*) 70 - 99 mg/dL   BUN 14  6 - 23 mg/dL   Creatinine, Ser 1.19  0.50 - 1.35 mg/dL   Calcium 10.3  8.4 - 10.5 mg/dL   GFR calc non Af Amer 71 (*) >90 mL/min   GFR calc  Af Amer 82 (*) >90 mL/min  CBC     Status: None   Collection Time    05/28/12  1:49 PM      Result Value Range   WBC 6.4  4.0 - 10.5 K/uL   RBC 5.06  4.22 - 5.81 MIL/uL   Hemoglobin 15.4  13.0 - 17.0 g/dL   HCT 42.8  39.0 - 52.0 %   MCV 84.6  78.0 - 100.0 fL   MCH 30.4  26.0 - 34.0 pg   MCHC 36.0  30.0 - 36.0 g/dL   RDW 13.2  11.5 - 15.5 %   Platelets 261  150 - 400 K/uL  MAGNESIUM     Status: None   Collection Time    05/28/12  1:49 PM      Result Value Range   Magnesium 1.9  1.5 - 2.5 mg/dL  POCT I-STAT TROPONIN I     Status: None   Collection Time    05/28/12  2:01 PM      Result Value Range   Troponin i, poc 0.00  0.00 - 0.08 ng/mL   Comment 3           HEPATIC FUNCTION PANEL     Status: None   Collection Time    05/28/12  5:33 PM      Result Value Range   Total Protein 7.3  6.0 - 8.3 g/dL   Albumin 4.1  3.5 - 5.2 g/dL   AST 22  0 - 37 U/L   ALT 20  0 - 53 U/L   Alkaline Phosphatase 94  39 - 117 U/L   Total Bilirubin 0.3  0.3 - 1.2 mg/dL   Bilirubin, Direct <0.1  0.0 - 0.3 mg/dL   Indirect Bilirubin NOT CALCULATED  0.3 - 0.9 mg/dL  LIPASE, BLOOD     Status: None   Collection Time    05/28/12  5:33 PM      Result  Value Range   Lipase 38  11 - 59 U/L  MRSA PCR SCREENING     Status: None   Collection Time    05/28/12  7:26 PM      Result Value Range   MRSA by PCR NEGATIVE  NEGATIVE  GLUCOSE, CAPILLARY     Status: Abnormal   Collection Time    05/28/12  7:54 PM      Result Value Range   Glucose-Capillary 124 (*) 70 - 99 mg/dL   Comment 1 Notify RN    TROPONIN I     Status: None   Collection Time    05/28/12  9:09 PM      Result Value Range   Troponin I <0.30  <0.30 ng/mL  TSH     Status: None   Collection Time    05/28/12  9:09 PM      Result Value Range   TSH 1.243  0.350 - 4.500 uIU/mL  HEMOGLOBIN AND HEMATOCRIT, BLOOD     Status: None   Collection Time    05/28/12  9:09 PM      Result Value Range   Hemoglobin 14.7  13.0 - 17.0 g/dL   HCT 41.0  39.0 - 52.0 %  LIPID PANEL     Status: Abnormal   Collection Time    05/29/12  2:15 AM      Result Value Range   Cholesterol 124  0 - 200 mg/dL   Triglycerides 210 (*) <150 mg/dL   HDL 23 (*) >  39 mg/dL   Total CHOL/HDL Ratio 5.4     VLDL 42 (*) 0 - 40 mg/dL   LDL Cholesterol 59  0 - 99 mg/dL  BASIC METABOLIC PANEL     Status: Abnormal   Collection Time    05/29/12  2:15 AM      Result Value Range   Sodium 138  135 - 145 mEq/L   Potassium 3.8  3.5 - 5.1 mEq/L   Chloride 104  96 - 112 mEq/L   CO2 16 (*) 19 - 32 mEq/L   Glucose, Bld 95  70 - 99 mg/dL   BUN 16  6 - 23 mg/dL   Creatinine, Ser 1.33  0.50 - 1.35 mg/dL   Calcium 9.3  8.4 - 10.5 mg/dL   GFR calc non Af Amer 62 (*) >90 mL/min   GFR calc Af Amer 72 (*) >90 mL/min  TROPONIN I     Status: None   Collection Time    05/29/12  2:24 AM      Result Value Range   Troponin I <0.30  <0.30 ng/mL  HEMOGLOBIN AND HEMATOCRIT, BLOOD     Status: None   Collection Time    05/29/12  2:24 AM      Result Value Range   Hemoglobin 14.5  13.0 - 17.0 g/dL   HCT 41.7  39.0 - 52.0 %  GLUCOSE, CAPILLARY     Status: None   Collection Time    05/29/12  7:54 AM      Result Value Range    Glucose-Capillary 98  70 - 99 mg/dL     Ct Head Wo Contrast  05/28/2012   *RADIOLOGY REPORT*  Clinical Data: Severe headache.  CT HEAD WITHOUT CONTRAST  Technique:  Contiguous axial images were obtained from the base of the skull through the vertex without contrast.  Comparison: Head CT scan 10/03/2010.  Findings: The brain appears normal without infarct, hemorrhage, mass lesion, mass effect, midline shift or abnormal extra-axial fluid collection.  The patient has chronic left maxillary sinus disease with wall thickening and complete opacification of the visualized sinus.  The calvarium is intact.  IMPRESSION:  1.  No acute finding. 2.  Chronic left maxillary sinus disease.   Original Report Authenticated By: Orlean Patten, M.D.   Dg Chest Portable 1 View  05/28/2012   *RADIOLOGY REPORT*  Clinical Data: Hematemesis.  Chest pain.  Shortness breath.  PORTABLE CHEST - 1 VIEW  Comparison: 10/02/2010  Findings: Chronic right mid thoracic rib deformity.  Otherwise unremarkable exam.  IMPRESSION:  1.  No acute findings.   Original Report Authenticated By: Van Clines, M.D.    ROS:  As stated above in the HPI otherwise negative.  Blood pressure 130/95, pulse 70, temperature 98.1 F (36.7 C), temperature source Oral, resp. rate 13, height 5\' 4"  (1.626 m), weight 256 lb (116.121 kg), SpO2 96.00%.    PE: Gen: NAD, Alert and Oriented HEENT:  [place]/AT, EOMI Neck: Supple, no LAD Lungs: CTA Bilaterally CV: RRR without M/G/R ABM: Soft, NTND, +BS Ext: No C/C/E  Assessment/Plan: 1) Hematemesis. 2) GERD. 3) OSA. 4) Malignant HTN - under control   His presentation is consistent with a  Mallory-Weiss tear, however, an EGD is necessary for further evaluation.  He has a history of GERD and he may also have an esophagitis.  Currently his HTN is under control.  Plan: 1) EGD today.  Johnchristopher Sarvis D 05/29/2012, 8:51 AM

## 2012-05-29 NOTE — Progress Notes (Signed)
[facility] Progress Note [place] TEAM 1 - Stepdown/ICU TEAM   Austin Shepard [id number] DOB: May 17, 1965 DOA: 05/28/2012 PCP: No primary provider on file.  Brief narrative: 47 year old male patient with underlying hypertension and diabetes and apparent history of CAD with prior intervention. He presented to the emergency department because of chest discomfort and headache. Initial blood pressure was 209/171. He normally receives care thru [facility] regarding his hypertension. His initial EKG and cardiac enzymes were unremarkable.  He's had previous admissions to [facility] in the past 2 years for chest pain and multiple admissions at [facility]. Patient reported previous cardiac catheterization with balloon angioplasty in 1999. Had a nuclear stress test in the past several years. In the emergency department he was started on a Cardene drip and given IV metoprolol with improvement in his blood pressure.  In the emergency department he had large volume hematemesis and patient was also reporting acid reflux symptoms but no previous hematemesis.  He has undergone endoscopy in the past but does not know which physician performed this. No apparent history of cirrhosis or bleeding varices. Not abdominal pain. Denied recent issues with melena or hematochezia or constipation. Was eventually started on a Protonix drip by the ER physician. Because of the hematemesis did not receive any aspirin in the ER but he apparently does take Plavix daily. He also reported history of prior stroke in 2013 with residual left-sided weakness.  Assessment/Plan:    Precordial chest pain/CAD (coronary artery disease) -ischemic WU negative since admit and no further cp -appreciate Cards assistance- no indication to pursue add'l ischemic w/u and have signed off -likely was 2/2 HTN    Malignant hypertension -rapidly weaned off Cardene -on max doses of multiple BP meds at home so suspect  noncomlpiance -watch BP closely w/ resumption of meds    Headache -residual from recent uncontrolled HTN -on chronic narcs at home but consider minimizing to avoid rebound HA -no NSAID's since recent hematemesis    Hematemesis/GERD (gastroesophageal reflux disease) -GI following -EGD today > NORMAL -Hgb stable -cont PPI    DM type 2 (diabetes mellitus, type 2)  -CBG controlled -not on meds at home    Tobacco abuse -cessation counseling  -Nicotine patch    Hypokalemia -repleted and now WNL    History of stroke with residual left sided weakness  -CT Head this admit without acute changes and no new changes c/w acute CVA    Depression/Anxiety/Chronic pain -resume home meds - minimize use of narcotics    Obesity/? OSA -weight reduction counseling -CPAP QHS    History of drug abuse -watch for drug-seeking behaviors - minimize use of narcotics    DVT prophylaxis: SCDs Code Status: Full Family Communication: Patient Disposition Plan: Transfer to medical floor - probable d/c home in AM Isolation: Contact for MRSA PCR positive status  Consultants: Cardiology Gastroenterology  Procedures: 5/16 - EGD: WNL  Antibiotics: None  HPI/Subjective: Patient alert and complaining of diffuse headache, also complaining of posterior neck bilateral upper shoulder pain and also pain with some tingling radiating down left arm.  Has multiple complaints which change th/o conversation and do not seem to be connected in any way via a unifying etiology.   Objective: Blood pressure 118/76, pulse 87, temperature 97.4 F (36.3 C), temperature source Oral, resp. rate 20, height 5\' 4"  (1.626 m), weight 116.121 kg (256 lb), SpO2 97.00%.  Intake/Output Summary (Last 24 hours) at 05/29/12 1650 Last data filed at 05/29/12 1100  Gross per  24 hour  Intake    443 ml  Output   1075 ml  Net   -632 ml     Exam: General: No acute respiratory distress Lungs: Clear to auscultation bilaterally  without wheezes or crackles, RA Cardiovascular: Regular rate and rhythm without murmur gallop or rub normal S1 and S2, no peripheral edema or JVD Abdomen: Nontender, nondistended, soft, bowel sounds positive, no rebound, no ascites, no appreciable mass Musculoskeletal: No significant cyanosis, clubbing of bilateral lower extremities Neurological: Alert and oriented x 3, moves all extremities x 4 without focal neurological deficits, CN 2-12 intact  Data Reviewed: Basic Metabolic Panel:  Recent Labs Lab 05/28/12 1349 05/29/12 0215  NA 141 138  K 3.0* 3.8  CL 106 104  CO2 21 16*  GLUCOSE 108* 95  BUN 14 16  CREATININE 1.19 1.33  CALCIUM 10.3 9.3  MG 1.9  --    Liver Function Tests:  Recent Labs Lab 05/28/12 1733  AST 22  ALT 20  ALKPHOS 94  BILITOT 0.3  PROT 7.3  ALBUMIN 4.1    Recent Labs Lab 05/28/12 1733  LIPASE 38   CBC:  Recent Labs Lab 05/28/12 1349 05/28/12 2109 05/29/12 0224  WBC 6.4  --   --   HGB 15.4 14.7 14.5  HCT 42.8 41.0 41.7  MCV 84.6  --   --   PLT 261  --   --    Cardiac Enzymes:  Recent Labs Lab 05/28/12 2109 05/29/12 0224  TROPONINI <0.30 <0.30   CBG:  Recent Labs Lab 05/28/12 1954 05/29/12 0754 05/29/12 1238  GLUCAP 124* 98 89    Recent Results (from the past 240 hour(s))  MRSA PCR SCREENING     Status: None   Collection Time    05/28/12  7:26 PM      Result Value Range Status   MRSA by PCR NEGATIVE  NEGATIVE Final   Comment:            The GeneXpert MRSA Assay (FDA     approved for NASAL specimens     only), is one component of a     comprehensive MRSA colonization     surveillance program. It is not     intended to diagnose MRSA     infection nor to guide or     monitor treatment for     MRSA infections.     Studies:  Recent x-ray studies have been reviewed in detail by the Attending Physician  Scheduled Meds:  Reviewed in detail by the Attending Physician   Erin Hearing, [place] [facility] Office  [phone] Pager [phone]  On-Call/Text Page:      [email or web address]      password [email or web address]  If 7PM-7AM, please contact night-coverage [email or web address] Password [email or web address] 05/29/2012, 4:50 PM   LOS: 1 day   I have personally examined this patient and reviewed the entire database. I have reviewed the above note, made any necessary editorial changes, and agree with its content.  Cherene Altes, MD [facility]

## 2012-05-30 LAB — COMPREHENSIVE METABOLIC PANEL
BUN: 21 mg/dL (ref 6–23)
CO2: 18 mEq/L — ABNORMAL LOW (ref 19–32)
Chloride: 109 mEq/L (ref 96–112)
Creatinine, Ser: 1.39 mg/dL — ABNORMAL HIGH (ref 0.50–1.35)
GFR calc non Af Amer: 59 mL/min — ABNORMAL LOW (ref >90)
Total Bilirubin: 0.1 mg/dL — ABNORMAL LOW (ref 0.3–1.2)

## 2012-05-30 LAB — CBC
HCT: 40.4 % (ref 39.0–52.0)
MCV: 88.2 fL (ref 78.0–100.0)
RBC: 4.58 MIL/uL (ref 4.22–5.81)
WBC: 5.4 10*3/uL (ref 4.0–10.5)

## 2012-05-30 LAB — GLUCOSE, CAPILLARY: Glucose-Capillary: 121 mg/dL — ABNORMAL HIGH (ref 70–99)

## 2012-05-30 MED ORDER — LABETALOL HCL 200 MG PO TABS: 200.0000 mg | ORAL_TABLET | Freq: Two times a day (BID) | ORAL | Status: DC

## 2012-05-30 NOTE — Discharge Summary (Signed)
DISCHARGE SUMMARY  Austin Shepard  MR#: [id number]  DOB:January 20, 1965  Date of Admission: 05/28/2012 Date of Discharge: 05/30/2012  Attending Physician:MCCLUNG,JEFFREY T  Patient's PCP:  PCP in [place], [place]  Consults:  Cardiology GI  Disposition: D/C Home  Follow-up Appts:     Follow-up Information   Schedule an appointment as soon as possible for a visit with Your Primary Care Doctor in [facility].     Tests Needing Follow-up: BP check in 7-10 days is advised  Discharge Diagnoses: Precordial chest pain / reported hx of CAD (coronary artery disease)  Malignant hypertension - likely due to noncompliance w/ meds Headache  Hematemesis/GERD (gastroesophageal reflux disease)  DM type 2  Tobacco abuse  Hypokalemia  History of stroke with reported residual left sided weakness  Depression/Anxiety/Chronic pain  Obesity OSA  History of drug abuse and EtOH abuse  Initial presentation: 47 year old male patient with underlying hypertension and diabetes and apparent history of CAD with prior intervention. He presented to the emergency department because of chest discomfort and headache. Initial blood pressure was 209/171. He normally receives care thru [facility] regarding his hypertension. His initial EKG and cardiac enzymes were unremarkable. He's had previous admissions to [facility] in the past 2 years for chest pain and multiple admissions at [facility]. Patient reported previous cardiac catheterization with balloon angioplasty in 1999. Had a nuclear stress test in the past several years. In the emergency department he was started on a Cardene drip and given IV metoprolol with improvement in his blood pressure. In the emergency department he had large volume hematemesis and patient was also reporting acid reflux symptoms but no previous hematemesis. He has undergone endoscopy in the past but does not know which physician performed this. No apparent history of  cirrhosis or bleeding varices. Not abdominal pain. Denied recent issues with melena or hematochezia or constipation. Was eventually started on a Protonix drip by the ER physician. Because of the hematemesis did not receive any aspirin in the ER but he apparently does take Plavix daily. He also reported history of prior stroke in 2013 with residual left-sided weakness.   Hospital Course:  Precordial chest pain/CAD (coronary artery disease)  -ischemic workup negative and no further cp  -Cardiology saw the pt in consultation and reported no indication to pursue add'l ischemic w/u  Malignant hypertension  -rapidly weaned off Cardene  -BP quickly improved with half dose of reported home regimen - as a result, hydralazine is being stopped at d/c to avoid hypotension should pt decide to take meds as outpt   Headache  -residual from uncontrolled HTN  -on chronic narcs at home but minimizing to avoid rebound HA  -no NSAID's since recent hematemesis  -resolved at time of D/C   Hematemesis/GERD (gastroesophageal reflux disease)  -GI evaluated as inpatient -EGD NORMAL  -Hgb stable, w/ d/c hgb of 13.4 -cont PPI   DM type 2 (diabetes mellitus, type 2)  -CBG controlled  -not on meds at home   Tobacco abuse  -cessation counseling provided -Nicotine patch used while inpatient   Hypokalemia  -repleted to 3.7 at time of d/c   History of stroke with residual left sided weakness  -CT Head this admit without acute changes and no new changes c/w acute CVA   Depression/Anxiety/Chronic pain  -resume home meds - minimized use of narcotics   Obesity w/ OSA  -weight reduction counseling  -CPAP QHS   History of drug abuse  -minimized use of narcotics  Medication List    STOP taking these medications       hydrALAZINE 100 MG tablet  Commonly known as:  APRESOLINE     NUEDEXTA 20-10 MG Caps  Generic drug:  Dextromethorphan-Quinidine      TAKE these medications       ACCU-CHEK  AVIVA VI  1 strip by In Vitro route every morning.     busPIRone 10 MG tablet  Commonly known as:  BUSPAR  Take 10 mg by mouth 3 (three) times daily.     carbamazepine 200 MG tablet  Commonly known as:  TEGRETOL  Take 400 mg by mouth 2 (two) times daily.     clonazePAM 2 MG tablet  Commonly known as:  KLONOPIN  Take 2 mg by mouth at bedtime.     clopidogrel 75 MG tablet  Commonly known as:  PLAVIX  Take 75 mg by mouth daily.     gabapentin 300 MG capsule  Commonly known as:  NEURONTIN  Take 600 mg by mouth 3 (three) times daily.     labetalol 200 MG tablet  Commonly known as:  NORMODYNE  Take 1 tablet (200 mg total) by mouth 2 (two) times daily.     lamoTRIgine 200 MG tablet  Commonly known as:  LAMICTAL  Take 200 mg by mouth 2 (two) times daily.     LANCETS ULTRA THIN 99991111 Misc  1 application by Does not apply route daily.     nitroGLYCERIN 0.4 MG SL tablet  Commonly known as:  NITROSTAT  Place 0.4 mg under the tongue as needed for chest pain.     oxyCODONE-acetaminophen 5-325 MG per tablet  Commonly known as:  PERCOCET/ROXICET  Take 1 tablet by mouth every 8 (eight) hours as needed for pain.     pantoprazole 40 MG tablet  Commonly known as:  PROTONIX  Take 40 mg by mouth daily.     rizatriptan 10 MG disintegrating tablet  Commonly known as:  MAXALT-MLT  Take 10 mg by mouth as needed for migraine. May repeat in 2 hours if needed.  Maximum 3 tablets in 24 hours.     tolterodine 2 MG tablet  Commonly known as:  DETROL  Take 2 mg by mouth every 12 (twelve) hours.     topiramate 50 MG tablet  Commonly known as:  TOPAMAX  Take 50 mg by mouth 2 (two) times daily.       Day of Discharge BP 116/83  Pulse 91  Temp(Src) 98.2 F (36.8 C) (Oral)  Resp 14  Ht 5\' 4"  (1.626 m)  Wt 116.121 kg (256 lb)  BMI 43.92 kg/m2  SpO2 99%  Physical Exam: General: No acute respiratory distress Lungs: Clear to auscultation bilaterally without wheezes or  crackles Cardiovascular: Regular rate and rhythm without murmur gallop or rub normal S1 and S2 Abdomen: Nontender, nondistended, soft, bowel sounds positive, no rebound, no ascites, no appreciable mass Extremities: No significant cyanosis, clubbing, or edema bilateral lower extremities  CBC     Status: None   Collection Time    05/30/12  6:20 AM      Result Value Range   WBC 5.4  4.0 - 10.5 K/uL   RBC 4.58  4.22 - 5.81 MIL/uL   Hemoglobin 13.4  13.0 - 17.0 g/dL   HCT 40.4  39.0 - 52.0 %   MCV 88.2  78.0 - 100.0 fL   MCH 29.3  26.0 - 34.0 pg   MCHC 33.2  30.0 -  36.0 g/dL   RDW 13.7  11.5 - 15.5 %   Platelets 196  150 - 400 K/uL  COMPREHENSIVE METABOLIC PANEL     Status: Abnormal   Collection Time    05/30/12  6:20 AM      Result Value Range   Sodium 140  135 - 145 mEq/L   Potassium 3.7  3.5 - 5.1 mEq/L   Chloride 109  96 - 112 mEq/L   CO2 18 (*) 19 - 32 mEq/L   Glucose, Bld 177 (*) 70 - 99 mg/dL   BUN 21  6 - 23 mg/dL   Creatinine, Ser 1.39 (*) 0.50 - 1.35 mg/dL   Calcium 9.1  8.4 - 10.5 mg/dL   Total Protein 6.2  6.0 - 8.3 g/dL   Albumin 3.0 (*) 3.5 - 5.2 g/dL   AST 14  0 - 37 U/L   ALT 20  0 - 53 U/L   Alkaline Phosphatase 88  39 - 117 U/L   Total Bilirubin 0.1 (*) 0.3 - 1.2 mg/dL   GFR calc non Af Amer 59 (*) >90 mL/min   GFR calc Af Amer 68 (*) >90 mL/min  GLUCOSE, CAPILLARY     Status: Abnormal   Collection Time    05/30/12  7:53 AM      Result Value Range   Glucose-Capillary 121 (*) 70 - 99 mg/dL    Time spent in discharge (includes decision making & examination of pt): 30 minutes  05/30/2012, 11:09 AM   Cherene Altes, MD [facility] Office  [phone] Pager [phone]  On-Call/Text Page:      [email or web address]      password [facility]

## 2012-05-30 NOTE — Progress Notes (Signed)
NURSING PROGRESS NOTE  Austin Shepard [id number] Discharge Data: 05/30/2012 1:52 PM Attending Provider: No att. providers found PCP:No primary provider on file.     [name] to be D/C'd Home per MD order. [name] given.    All IV's discontinued with no bleeding noted.  All belongings returned to patient for patient to take home.   Last Vital Signs:  Blood pressure 116/83, pulse 91, temperature 98.2 F (36.8 C), temperature source Oral, resp. rate 14, height 5\' 4"  (1.626 m), weight 116.121 kg (256 lb), SpO2 99.00%.  Discharge Medication List   Medication List    STOP taking these medications       hydrALAZINE 100 MG tablet  Commonly known as:  APRESOLINE     NUEDEXTA 20-10 MG Caps  Generic drug:  Dextromethorphan-Quinidine      TAKE these medications       ACCU-CHEK AVIVA VI  1 strip by In Vitro route every morning.     busPIRone 10 MG tablet  Commonly known as:  BUSPAR  Take 10 mg by mouth 3 (three) times daily.     carbamazepine 200 MG tablet  Commonly known as:  TEGRETOL  Take 400 mg by mouth 2 (two) times daily.     clonazePAM 2 MG tablet  Commonly known as:  KLONOPIN  Take 2 mg by mouth at bedtime.     clopidogrel 75 MG tablet  Commonly known as:  PLAVIX  Take 75 mg by mouth daily.     gabapentin 300 MG capsule  Commonly known as:  NEURONTIN  Take 600 mg by mouth 3 (three) times daily.     labetalol 200 MG tablet  Commonly known as:  NORMODYNE  Take 1 tablet (200 mg total) by mouth 2 (two) times daily.     lamoTRIgine 200 MG tablet  Commonly known as:  LAMICTAL  Take 200 mg by mouth 2 (two) times daily.     LANCETS ULTRA THIN 99991111 Misc  1 application by Does not apply route daily.     nitroGLYCERIN 0.4 MG SL tablet  Commonly known as:  NITROSTAT  Place 0.4 mg under the tongue as needed for chest pain.     oxyCODONE-acetaminophen 5-325 MG per tablet  Commonly known as:  PERCOCET/ROXICET  Take 1 tablet by mouth every 8 (eight) hours as  needed for pain.     pantoprazole 40 MG tablet  Commonly known as:  PROTONIX  Take 40 mg by mouth daily.     rizatriptan 10 MG disintegrating tablet  Commonly known as:  MAXALT-MLT  Take 10 mg by mouth as needed for migraine. May repeat in 2 hours if needed.  Maximum 3 tablets in 24 hours.     tolterodine 2 MG tablet  Commonly known as:  DETROL  Take 2 mg by mouth every 12 (twelve) hours.     topiramate 50 MG tablet  Commonly known as:  TOPAMAX  Take 50 mg by mouth 2 (two) times daily.        [name], MSN, RN, [employer]

## 2020-06-12 ENCOUNTER — Emergency Department (HOSPITAL_COMMUNITY): Payer: Medicare Other

## 2020-06-12 ENCOUNTER — Observation Stay (HOSPITAL_COMMUNITY): Payer: Medicare Other

## 2020-06-12 ENCOUNTER — Encounter (HOSPITAL_COMMUNITY): Payer: Self-pay | Admitting: Emergency Medicine

## 2020-06-12 ENCOUNTER — Observation Stay (HOSPITAL_COMMUNITY)
Admission: EM | Admit: 2020-06-12 | Discharge: 2020-06-14 | Disposition: A | Discharge status: Home / Self Care | Payer: Medicare Other | Attending: Student | Admitting: Student

## 2020-06-12 DIAGNOSIS — Z8673 Personal history of transient ischemic attack (TIA), and cerebral infarction without residual deficits: Secondary | ICD-10-CM | POA: Insufficient documentation

## 2020-06-12 DIAGNOSIS — J449 Chronic obstructive pulmonary disease, unspecified: Secondary | ICD-10-CM | POA: Diagnosis not present

## 2020-06-12 DIAGNOSIS — F1911 Other psychoactive substance abuse, in remission: Secondary | ICD-10-CM | POA: Diagnosis present

## 2020-06-12 DIAGNOSIS — Z20822 Contact with and (suspected) exposure to covid-19: Secondary | ICD-10-CM | POA: Insufficient documentation

## 2020-06-12 DIAGNOSIS — R1084 Generalized abdominal pain: Secondary | ICD-10-CM

## 2020-06-12 DIAGNOSIS — J181 Lobar pneumonia, unspecified organism: Principal | ICD-10-CM | POA: Insufficient documentation

## 2020-06-12 DIAGNOSIS — G8929 Other chronic pain: Secondary | ICD-10-CM | POA: Diagnosis present

## 2020-06-12 DIAGNOSIS — F17210 Nicotine dependence, cigarettes, uncomplicated: Secondary | ICD-10-CM | POA: Insufficient documentation

## 2020-06-12 DIAGNOSIS — J189 Pneumonia, unspecified organism: Secondary | ICD-10-CM | POA: Diagnosis not present

## 2020-06-12 DIAGNOSIS — N186 End stage renal disease: Secondary | ICD-10-CM | POA: Insufficient documentation

## 2020-06-12 DIAGNOSIS — R109 Unspecified abdominal pain: Secondary | ICD-10-CM

## 2020-06-12 DIAGNOSIS — I2510 Atherosclerotic heart disease of native coronary artery without angina pectoris: Secondary | ICD-10-CM | POA: Insufficient documentation

## 2020-06-12 DIAGNOSIS — Z7982 Long term (current) use of aspirin: Secondary | ICD-10-CM | POA: Insufficient documentation

## 2020-06-12 DIAGNOSIS — I10 Essential (primary) hypertension: Secondary | ICD-10-CM | POA: Diagnosis present

## 2020-06-12 DIAGNOSIS — Z79899 Other long term (current) drug therapy: Secondary | ICD-10-CM | POA: Insufficient documentation

## 2020-06-12 DIAGNOSIS — M542 Cervicalgia: Secondary | ICD-10-CM | POA: Diagnosis present

## 2020-06-12 DIAGNOSIS — E119 Type 2 diabetes mellitus without complications: Secondary | ICD-10-CM

## 2020-06-12 DIAGNOSIS — E1122 Type 2 diabetes mellitus with diabetic chronic kidney disease: Secondary | ICD-10-CM | POA: Insufficient documentation

## 2020-06-12 DIAGNOSIS — Z992 Dependence on renal dialysis: Secondary | ICD-10-CM | POA: Insufficient documentation

## 2020-06-12 DIAGNOSIS — I120 Hypertensive chronic kidney disease with stage 5 chronic kidney disease or end stage renal disease: Secondary | ICD-10-CM | POA: Diagnosis not present

## 2020-06-12 DIAGNOSIS — R112 Nausea with vomiting, unspecified: Secondary | ICD-10-CM

## 2020-06-12 DIAGNOSIS — R0602 Shortness of breath: Secondary | ICD-10-CM

## 2020-06-12 LAB — RESP PANEL BY RT-PCR (FLU A&B, COVID) ARPGX2
Influenza A by PCR: NEGATIVE
Influenza B by PCR: NEGATIVE
SARS Coronavirus 2 by RT PCR: NEGATIVE

## 2020-06-12 LAB — CBC WITH DIFFERENTIAL/PLATELET
Abs Immature Granulocytes: 0.05 10*3/uL (ref 0.00–0.07)
Basophils Absolute: 0 10*3/uL (ref 0.0–0.1)
Basophils Relative: 1 %
Eosinophils Absolute: 0.1 10*3/uL (ref 0.0–0.5)
Eosinophils Relative: 2 %
HCT: 28.6 % — ABNORMAL LOW (ref 39.0–52.0)
Hemoglobin: 9.4 g/dL — ABNORMAL LOW (ref 13.0–17.0)
Immature Granulocytes: 1 %
Lymphocytes Relative: 13 %
Lymphs Abs: 1 10*3/uL (ref 0.7–4.0)
MCH: 31.1 pg (ref 26.0–34.0)
MCHC: 32.9 g/dL (ref 30.0–36.0)
MCV: 94.7 fL (ref 80.0–100.0)
Monocytes Absolute: 1 10*3/uL (ref 0.1–1.0)
Monocytes Relative: 12 %
Neutro Abs: 5.7 10*3/uL (ref 1.7–7.7)
Neutrophils Relative %: 71 %
Platelets: 287 10*3/uL (ref 150–400)
RBC: 3.02 MIL/uL — ABNORMAL LOW (ref 4.22–5.81)
RDW: 14.1 % (ref 11.5–15.5)
WBC: 7.9 10*3/uL (ref 4.0–10.5)
nRBC: 0 % (ref 0.0–0.2)

## 2020-06-12 LAB — COMPREHENSIVE METABOLIC PANEL
ALT: 12 U/L (ref 0–44)
AST: 13 U/L — ABNORMAL LOW (ref 15–41)
Albumin: 3 g/dL — ABNORMAL LOW (ref 3.5–5.0)
Alkaline Phosphatase: 68 U/L (ref 38–126)
Anion gap: 12 (ref 5–15)
BUN: 29 mg/dL — ABNORMAL HIGH (ref 6–20)
CO2: 25 mmol/L (ref 22–32)
Calcium: 9.3 mg/dL (ref 8.9–10.3)
Chloride: 98 mmol/L (ref 98–111)
Creatinine, Ser: 5.75 mg/dL — ABNORMAL HIGH (ref 0.61–1.24)
GFR, Estimated: 11 mL/min — ABNORMAL LOW (ref >60)
Glucose, Bld: 91 mg/dL (ref 70–99)
Potassium: 4 mmol/L (ref 3.5–5.1)
Sodium: 135 mmol/L (ref 135–145)
Total Bilirubin: 0.4 mg/dL (ref 0.3–1.2)
Total Protein: 6.7 g/dL (ref 6.5–8.1)

## 2020-06-12 LAB — LACTIC ACID, PLASMA
Lactic Acid, Venous: 0.9 mmol/L (ref 0.5–1.9)
Lactic Acid, Venous: 0.7 mmol/L (ref 0.5–1.9)

## 2020-06-12 LAB — TROPONIN I (HIGH SENSITIVITY): Troponin I (High Sensitivity): 34 ng/L — ABNORMAL HIGH (ref <18)

## 2020-06-12 LAB — LIPASE, BLOOD: Lipase: 35 U/L (ref 11–51)

## 2020-06-12 LAB — CK: Total CK: 35 U/L — ABNORMAL LOW (ref 49–397)

## 2020-06-12 MED ORDER — FLUTICASONE-UMECLIDIN-VILANT 200-62.5-25 MCG/INH IN AEPB: 1.0000 | INHALATION_SPRAY | Freq: Every day | RESPIRATORY_TRACT | Status: DC

## 2020-06-12 MED ORDER — FUROSEMIDE 20 MG PO TABS
20.0000 mg | ORAL_TABLET | Freq: Every day | ORAL | Status: DC
  Administered 2020-06-12 – 2020-06-14 (×3): 20 mg via ORAL
  Filled 2020-06-12 (×3): qty 1

## 2020-06-12 MED ORDER — MORPHINE SULFATE (PF) 4 MG/ML IV SOLN
4.0000 mg | Freq: Once | INTRAVENOUS | Status: DC
  Filled 2020-06-12: qty 1

## 2020-06-12 MED ORDER — LACTULOSE 10 GM/15ML PO SOLN
30.0000 g | Freq: Every day | ORAL | Status: DC | PRN
  Filled 2020-06-12: qty 45

## 2020-06-12 MED ORDER — ONDANSETRON 4 MG PO TBDP: 4.0000 mg | ORAL_TABLET | Freq: Three times a day (TID) | ORAL | Status: DC | PRN

## 2020-06-12 MED ORDER — SEVELAMER CARBONATE 800 MG PO TABS
1600.0000 mg | ORAL_TABLET | Freq: Three times a day (TID) | ORAL | Status: DC
  Administered 2020-06-13 (×3): 1600 mg via ORAL
  Filled 2020-06-12 (×4): qty 2

## 2020-06-12 MED ORDER — ONDANSETRON HCL 4 MG/2ML IJ SOLN
4.0000 mg | Freq: Four times a day (QID) | INTRAMUSCULAR | Status: DC | PRN
  Administered 2020-06-13 (×2): 4 mg via INTRAVENOUS
  Filled 2020-06-12 (×2): qty 2

## 2020-06-12 MED ORDER — ESCITALOPRAM OXALATE 20 MG PO TABS
20.0000 mg | ORAL_TABLET | Freq: Every day | ORAL | Status: DC
  Administered 2020-06-12 – 2020-06-14 (×3): 20 mg via ORAL
  Filled 2020-06-12 (×2): qty 1
  Filled 2020-06-12: qty 2

## 2020-06-12 MED ORDER — LABETALOL HCL 200 MG PO TABS: 200.0000 mg | ORAL_TABLET | Freq: Two times a day (BID) | ORAL | Status: DC

## 2020-06-12 MED ORDER — AMLODIPINE BESYLATE 10 MG PO TABS
10.0000 mg | ORAL_TABLET | Freq: Every day | ORAL | Status: DC
  Administered 2020-06-12 – 2020-06-14 (×3): 10 mg via ORAL
  Filled 2020-06-12 (×2): qty 1
  Filled 2020-06-12: qty 2

## 2020-06-12 MED ORDER — SEVELAMER CARBONATE 800 MG PO TABS
1600.0000 mg | ORAL_TABLET | ORAL | Status: DC
  Administered 2020-06-13: 1600 mg via ORAL
  Filled 2020-06-12 (×2): qty 2

## 2020-06-12 MED ORDER — MOMETASONE FURO-FORMOTEROL FUM 200-5 MCG/ACT IN AERO: 2.0000 | INHALATION_SPRAY | Freq: Two times a day (BID) | RESPIRATORY_TRACT | Status: DC

## 2020-06-12 MED ORDER — METOCLOPRAMIDE HCL 5 MG/ML IJ SOLN: 10.0000 mg | Freq: Three times a day (TID) | INTRAMUSCULAR | Status: DC

## 2020-06-12 MED ORDER — ONDANSETRON HCL 4 MG/2ML IJ SOLN
4.0000 mg | Freq: Once | INTRAMUSCULAR | Status: AC
  Administered 2020-06-12: 4 mg via INTRAVENOUS
  Filled 2020-06-12: qty 2

## 2020-06-12 MED ORDER — FAMOTIDINE 20 MG PO TABS
20.0000 mg | ORAL_TABLET | Freq: Every day | ORAL | Status: DC
  Administered 2020-06-12 – 2020-06-14 (×3): 20 mg via ORAL
  Filled 2020-06-12 (×3): qty 1

## 2020-06-12 MED ORDER — SODIUM CHLORIDE 0.9 % IV SOLN
500.0000 mg | Freq: Once | INTRAVENOUS | Status: AC
  Administered 2020-06-12: 500 mg via INTRAVENOUS
  Filled 2020-06-12: qty 500

## 2020-06-12 MED ORDER — ATORVASTATIN CALCIUM 40 MG PO TABS
40.0000 mg | ORAL_TABLET | Freq: Every day | ORAL | Status: DC
  Administered 2020-06-12 – 2020-06-13 (×2): 40 mg via ORAL
  Filled 2020-06-12 (×2): qty 1

## 2020-06-12 MED ORDER — LATANOPROST 0.005 % OP SOLN
1.0000 [drp] | Freq: Every day | OPHTHALMIC | Status: DC
  Filled 2020-06-12: qty 2.5

## 2020-06-12 MED ORDER — METOCLOPRAMIDE HCL 5 MG/ML IJ SOLN
10.0000 mg | Freq: Once | INTRAMUSCULAR | Status: AC
  Administered 2020-06-12: 10 mg via INTRAVENOUS
  Filled 2020-06-12: qty 2

## 2020-06-12 MED ORDER — SODIUM CHLORIDE 0.9 % IV SOLN
500.0000 mg | INTRAVENOUS | Status: DC
  Administered 2020-06-13: 500 mg via INTRAVENOUS
  Filled 2020-06-12 (×2): qty 500

## 2020-06-12 MED ORDER — HEPARIN SODIUM (PORCINE) 5000 UNIT/ML IJ SOLN
5000.0000 [IU] | Freq: Three times a day (TID) | INTRAMUSCULAR | Status: DC
  Administered 2020-06-12 – 2020-06-14 (×5): 5000 [IU] via SUBCUTANEOUS
  Filled 2020-06-12 (×5): qty 1

## 2020-06-12 MED ORDER — PANTOPRAZOLE SODIUM 40 MG IV SOLR
40.0000 mg | Freq: Once | INTRAVENOUS | Status: AC
  Administered 2020-06-12: 40 mg via INTRAVENOUS
  Filled 2020-06-12: qty 40

## 2020-06-12 MED ORDER — SENNOSIDES-DOCUSATE SODIUM 8.6-50 MG PO TABS: 2.0000 | ORAL_TABLET | Freq: Every evening | ORAL | Status: DC | PRN

## 2020-06-12 MED ORDER — METHOCARBAMOL 500 MG PO TABS
500.0000 mg | ORAL_TABLET | Freq: Once | ORAL | Status: AC
  Administered 2020-06-12: 500 mg via ORAL
  Filled 2020-06-12: qty 1

## 2020-06-12 MED ORDER — SODIUM CHLORIDE 0.9 % IV SOLN
1.0000 g | INTRAVENOUS | Status: DC
  Administered 2020-06-13: 1 g via INTRAVENOUS
  Filled 2020-06-12 (×2): qty 10

## 2020-06-12 MED ORDER — FENTANYL CITRATE (PF) 100 MCG/2ML IJ SOLN
50.0000 ug | Freq: Once | INTRAMUSCULAR | Status: AC
  Administered 2020-06-12: 50 ug via INTRAVENOUS
  Filled 2020-06-12: qty 2

## 2020-06-12 MED ORDER — ACETAMINOPHEN 325 MG PO TABS
650.0000 mg | ORAL_TABLET | Freq: Four times a day (QID) | ORAL | Status: DC | PRN
  Administered 2020-06-12: 650 mg via ORAL
  Filled 2020-06-12: qty 2

## 2020-06-12 MED ORDER — SODIUM CHLORIDE 0.9 % IV SOLN
1.0000 g | Freq: Once | INTRAVENOUS | Status: AC
  Administered 2020-06-12: 1 g via INTRAVENOUS
  Filled 2020-06-12: qty 10

## 2020-06-12 MED ORDER — UMECLIDINIUM BROMIDE 62.5 MCG/INH IN AEPB
1.0000 | INHALATION_SPRAY | Freq: Every day | RESPIRATORY_TRACT | Status: DC
  Administered 2020-06-12 – 2020-06-14 (×2): 1 via RESPIRATORY_TRACT
  Filled 2020-06-12: qty 7

## 2020-06-12 MED ORDER — CARVEDILOL 25 MG PO TABS
25.0000 mg | ORAL_TABLET | Freq: Two times a day (BID) | ORAL | Status: DC
  Administered 2020-06-12 – 2020-06-14 (×4): 25 mg via ORAL
  Filled 2020-06-12 (×2): qty 1
  Filled 2020-06-12: qty 2
  Filled 2020-06-12: qty 1

## 2020-06-12 MED ORDER — POLYSACCHARIDE IRON COMPLEX 150 MG PO CAPS: 150.0000 mg | ORAL_CAPSULE | ORAL | Status: DC

## 2020-06-12 MED ORDER — PANTOPRAZOLE SODIUM 40 MG PO TBEC
40.0000 mg | DELAYED_RELEASE_TABLET | Freq: Every day | ORAL | Status: DC
  Administered 2020-06-12 – 2020-06-14 (×3): 40 mg via ORAL
  Filled 2020-06-12 (×3): qty 1

## 2020-06-12 MED ORDER — FLUTICASONE FUROATE-VILANTEROL 200-25 MCG/INH IN AEPB
1.0000 | INHALATION_SPRAY | Freq: Every day | RESPIRATORY_TRACT | Status: DC
  Administered 2020-06-12 – 2020-06-14 (×2): 1 via RESPIRATORY_TRACT
  Filled 2020-06-12: qty 28

## 2020-06-12 MED ORDER — BUPROPION HCL ER (XL) 150 MG PO TB24
300.0000 mg | ORAL_TABLET | Freq: Every day | ORAL | Status: DC
  Administered 2020-06-13 – 2020-06-14 (×2): 300 mg via ORAL
  Filled 2020-06-12: qty 2
  Filled 2020-06-12: qty 1
  Filled 2020-06-12: qty 2
  Filled 2020-06-12: qty 1

## 2020-06-12 MED ORDER — SODIUM CHLORIDE 0.9 % IV BOLUS
500.0000 mL | Freq: Once | INTRAVENOUS | Status: AC
  Administered 2020-06-12: 500 mL via INTRAVENOUS

## 2020-06-12 MED ORDER — ASPIRIN EC 81 MG PO TBEC
81.0000 mg | DELAYED_RELEASE_TABLET | Freq: Every day | ORAL | Status: DC
  Administered 2020-06-12 – 2020-06-14 (×3): 81 mg via ORAL
  Filled 2020-06-12 (×3): qty 1

## 2020-06-12 MED ORDER — LORAZEPAM 2 MG/ML IJ SOLN
1.0000 mg | Freq: Once | INTRAMUSCULAR | Status: AC | PRN
  Administered 2020-06-12: 1 mg via INTRAVENOUS
  Filled 2020-06-12: qty 1

## 2020-06-12 NOTE — ED Triage Notes (Signed)
Pt reports nausea, vomiting, and abd pain x 4 days.  Reports chest pain and SOB x 2 days.  Denies cough.  Missed dialysis on Friday but completed it today.

## 2020-06-12 NOTE — ED Notes (Signed)
Pt in imaging at this time. IV team came to bedside and will return after consult in another room in ED.

## 2020-06-12 NOTE — ED Provider Notes (Signed)
Emergency Medicine Provider Triage Evaluation Note  Austin Shepard , a 55 y.o. male  was evaluated in triage.  Pt complains of nv .x5 days. Chest pain and sob for the last several days. Subjective fevers at home. hx esrd, missed dialysis on Friday, but completed today. Had graft surgery 5/26.  Review of Systems  Positive: Nv, chest pain, sob, subjective fevers Negative: constipation  Physical Exam  BP 139/89 (BP Location: Right Arm)   Pulse 96   Temp 99 F (37.2 C) (Oral)   Resp 18   SpO2 96%  Gen:   Awake, no distress   Resp:  Normal effort  MSK:   Moves extremities without difficulty  Other:  Diffuse abd ttp without guarding, left graft in place with surrounding ttp and some mild erythema  Medical Decision Making  Medically screening exam initiated at 3:00 PM.  Appropriate orders placed.  Austin Shepard was informed that the remainder of the evaluation will be completed by another provider, this initial triage assessment does not replace that evaluation, and the importance of remaining in the ED until their evaluation is complete.     Rodney Booze, PA-C 06/12/20 1502    Gareth Morgan, MD 06/13/20 1447

## 2020-06-12 NOTE — ED Provider Notes (Signed)
[place] EMERGENCY DEPARTMENT Provider Note   CSN: [id number] Arrival date & time: 06/12/20  1441     History Chief Complaint  Patient presents with  . Abdominal Pain  . Chest Pain    Nazaire Cordial is a 55 y.o. male.  Mildred Bollard is a 55 y.o. male with hx of ESRD on HD, HTN, diabetes, MI, stroke, seizures, sleep apnea, who presents for evaluation of Abdominal pain, chest pain and vomiting. Patient reports for the past 4 days he has been having generalized abdominal and vomiting, has not been able to keep anything down. Reports over the past two days he has developed central chest pain associated with some shortness of breath and cough. Has had some chills, but denies fever. No known sick contacts. Missed dialysis on Friday, but completed it today. Did not feel like getting dialysis helped his shortness of breath. Has recently had a lot of issues with persistent nausea and vomiting.  The history is provided by the patient and medical records.       Past Medical History:  Diagnosis Date  . Anxiety   . Chronic kidney disease    STAGE 1   . Coronary artery disease   . Depression   . Diabetes mellitus   . Headache(784.0)   . Hypertension   . Myocardial infarction ([place])   . Seizures ([place])   . Shortness of breath   . Sleep apnea    CPAP  . Stroke [facility])     Patient Active Problem List   Diagnosis Date Noted  . Right lower lobe pneumonia 06/12/2020  . ESRD (end stage renal disease) ([place]) 06/12/2020  . Chronic abdominal pain 06/12/2020  . Nausea & vomiting 06/12/2020  . Neck pain 06/12/2020  . Malignant hypertension 05/28/2012  . CAD (coronary artery disease) 05/28/2012  . GERD (gastroesophageal reflux disease) 05/28/2012  . History of stroke with residual left sided weakness 05/28/2012  . Depression 05/28/2012  . Anxiety 05/28/2012  . DM type 2 (diabetes mellitus, type 2) ([place]) 05/28/2012  . Obesity 05/28/2012  . Tobacco abuse 05/28/2012  .  History of drug abuse ([place]) 05/28/2012    Past Surgical History:  Procedure Laterality Date  . ESOPHAGOGASTRODUODENOSCOPY N/A 05/29/2012   Procedure: ESOPHAGOGASTRODUODENOSCOPY (EGD);  Surgeon: Beryle Beams, MD;  Location: [facility] ENDOSCOPY;  Service: Endoscopy;  Laterality: N/A;  . LEG SURGERY     COMPARTMENT SYNDROME        Family History  Problem Relation Age of Onset  . Dementia Mother   . Seizures Brother   . Stroke Paternal Grandmother   . Stroke Paternal Grandfather     Social History   Tobacco Use  . Smoking status: Current Every Day Smoker    Packs/day: 0.50    Years: 35.00    Pack years: 17.50    Types: Cigarettes  . Smokeless tobacco: Never Used  Substance Use Topics  . Alcohol use: Not Currently  . Drug use: Not Currently    Comment: HISTORY    HAS BEEN  CLEAN 16 YEARS    Home Medications Prior to Admission medications   Medication Sig Start Date End Date Taking? Authorizing Provider  amLODipine (NORVASC) 10 MG tablet Take 10 mg by mouth daily. 06/09/20  Yes [provider]  ascorbic acid (VITAMIN C) 1000 MG tablet Take 1,000 mg by mouth at bedtime.   Yes [provider]  aspirin 81 MG EC tablet Take 81 mg by mouth daily. 08/27/16  Yes [provider]  atorvastatin (LIPITOR) 40 MG tablet Take 40 mg by mouth at bedtime. 04/14/20  Yes [provider]  budesonide-formoterol (SYMBICORT) 160-4.5 MCG/ACT inhaler Inhale 2 puffs into the lungs in the morning and at bedtime. 06/03/17  Yes [provider]  buPROPion (WELLBUTRIN XL) 300 MG 24 hr tablet Take 300 mg by mouth daily.   Yes [provider]  carvedilol (COREG) 25 MG tablet Take 25 mg by mouth 2 (two) times daily. 06/09/20  Yes [provider]  escitalopram (LEXAPRO) 10 MG tablet Take 20 mg by mouth daily. 02/16/20  Yes [provider]  famotidine (PEPCID) 20 MG tablet Take 20 mg by mouth daily. 04/28/20  Yes [provider]  furosemide  (LASIX) 20 MG tablet Take 20 mg by mouth daily. 01/25/20  Yes [provider]  iron polysaccharides (NIFEREX) 150 MG capsule Take 150 mg by mouth See admin instructions. Monday,wednesday and Friday at [place] 01/29/20  Yes [provider]  labetalol (NORMODYNE) 200 MG tablet Take 1 tablet (200 mg total) by mouth 2 (two) times daily. 05/30/12  Yes Cherene Altes, MD  lactulose (CHRONULAC) 10 GM/15ML solution Take 45 mLs by mouth daily as needed for mild constipation. 04/27/20  Yes [provider]  latanoprost (XALATAN) 0.005 % ophthalmic solution Place 1 drop into both eyes at bedtime. 09/09/17  Yes [provider]  nitroGLYCERIN (NITROSTAT) 0.4 MG SL tablet Place 0.4 mg under the tongue as needed for chest pain.   Yes [provider]  Omega-3 Fatty Acids (FISH OIL) 1000 MG CAPS Take 2 g by mouth daily. 05/22/20  Yes [provider]  ondansetron (ZOFRAN-ODT) 4 MG disintegrating tablet Take 4 mg by mouth every 8 (eight) hours as needed for vomiting or nausea. 05/28/20  Yes [provider]  pantoprazole (PROTONIX) 40 MG tablet Take 40 mg by mouth daily.   Yes [provider]  senna-docusate (SENOKOT-S) 8.6-50 MG tablet Take 2 tablets by mouth at bedtime as needed for mild constipation. 04/27/20  Yes [provider]  sevelamer carbonate (RENVELA) 800 MG tablet Take 1,600 mg by mouth See admin instructions. 2 tabs with meals and snacks 04/27/20  Yes [provider]  TRELEGY ELLIPTA 200-62.5-25 MCG/INH AEPB Inhale 1 puff into the lungs daily. 05/14/20  Yes [provider]  vitamin E 45 MG (100 UNITS) capsule Take 45 Units by mouth at bedtime.   Yes [provider]  zinc sulfate 220 (50 Zn) MG capsule Take 50 mg by mouth at bedtime.   Yes [provider]    Allergies    Quetiapine, Trazodone and nefazodone, Depakote [divalproex sodium], Ivp dye [iodinated diagnostic agents], Keflex [cephalexin],  and Other  Review of Systems   Review of Systems  Constitutional: Positive for chills. Negative for fever.  HENT: Negative.   Respiratory: Positive for cough and shortness of breath.   Gastrointestinal: Positive for abdominal pain, nausea and vomiting. Negative for constipation and diarrhea.  Genitourinary: Negative for dysuria.  Musculoskeletal: Positive for myalgias and neck pain. Negative for arthralgias.  Skin: Negative for color change and rash.    Physical Exam Updated Vital Signs BP 139/89 (BP Location: Right Arm)   Pulse 96   Temp 99 F (37.2 C) (Oral)   Resp 18   SpO2 96%   Physical Exam Vitals and nursing note reviewed.  Constitutional:      General: He is not in acute distress.    Appearance: He is well-developed. He is obese.  He is not diaphoretic.     Comments: Alert, chronically ill appearing  HENT:     Head: Normocephalic and atraumatic.     Mouth/Throat:     Comments: Mucous membranes slightly dry Eyes:     General:        Right eye: No discharge.        Left eye: No discharge.     Pupils: Pupils are equal, round, and reactive to light.  Neck:     Comments: Tenderness over right neck musculature, no midline tenderness, no step off or deformity Cardiovascular:     Rate and Rhythm: Normal rate and regular rhythm.     Heart sounds: Normal heart sounds.  Pulmonary:     Effort: No respiratory distress.     Breath sounds: Normal breath sounds. No wheezing or rales.     Comments: Patient mildly tachypneic, able to speak in full sentences, some decreased air movement noted with rales in bases Abdominal:     General: Bowel sounds are normal. There is no distension.     Palpations: Abdomen is soft. There is no mass or pulsatile mass.     Tenderness: There is no guarding.     Comments: Abdomen soft, bowel sounds present, generalized tenderness noted throughout the abdomen, most severe in periumbilical region, no peritoneal signs  Musculoskeletal:         General: No deformity.     Cervical back: Neck supple. Tenderness present.  Skin:    General: Skin is warm and dry.     Capillary Refill: Capillary refill takes less than 2 seconds.  Neurological:     Mental Status: He is alert.     Coordination: Coordination normal.     Comments: Speech is clear, able to follow commands Moves extremities without ataxia, coordination intact  Psychiatric:        Mood and Affect: Mood normal.        Behavior: Behavior normal.     ED Results / Procedures / Treatments   Labs (all labs ordered are listed, but only abnormal results are displayed) Labs Reviewed  COMPREHENSIVE METABOLIC PANEL - Abnormal; Notable for the following components:      Result Value   BUN 29 (*)    Creatinine, Ser 5.75 (*)    Albumin 3.0 (*)    AST 13 (*)    GFR, Estimated 11 (*)    All other components within normal limits  CBC WITH DIFFERENTIAL/PLATELET - Abnormal; Notable for the following components:   RBC 3.02 (*)    Hemoglobin 9.4 (*)    HCT 28.6 (*)    All other components within normal limits  CK - Abnormal; Notable for the following components:   Total CK 35 (*)    All other components within normal limits  TROPONIN I (HIGH SENSITIVITY) - Abnormal; Notable for the following components:   Troponin I (High Sensitivity) 34 (*)    All other components within normal limits  RESP PANEL BY RT-PCR (FLU A&B, COVID) ARPGX2  CULTURE, BLOOD (ROUTINE X 2)  CULTURE, BLOOD (ROUTINE X 2)  MRSA PCR SCREENING  LIPASE, BLOOD  LACTIC ACID, PLASMA  LACTIC ACID, PLASMA  HIV ANTIBODY (ROUTINE TESTING W REFLEX)  TROPONIN I (HIGH SENSITIVITY)    EKG EKG Interpretation  Date/Time:  Monday Jun 12 2020 14:48:38 EDT Ventricular Rate:  90 PR Interval:  136 QRS Duration: 86 QT Interval:  386 QTC Calculation: 472 R Axis:   -11 Text Interpretation: Normal sinus rhythm  Normal ECG t wave changes on priorECG resolved Confirmed by Dorie Rank [phone]) on 06/12/2020 3:07:29  PM   Radiology DG Chest 2 View  Result Date: 06/12/2020 CLINICAL DATA:  Chest pain, shortness of breath EXAM: CHEST - 2 VIEW COMPARISON:  05/28/2020 FINDINGS: Dual lumen right-sided central venous catheter in unchanged position. No focal consolidation. No pleural effusion or pneumothorax. Heart and mediastinal contours are unremarkable. No acute osseous abnormality. IMPRESSION: No active cardiopulmonary disease. Electronically Signed   By: Kathreen Devoid   On: 06/12/2020 15:38   CT CHEST ABDOMEN PELVIS WO CONTRAST  Result Date: 06/12/2020 CLINICAL DATA:  Pain EXAM: CT CHEST, ABDOMEN AND PELVIS WITHOUT CONTRAST TECHNIQUE: Multidetector CT imaging of the chest, abdomen and pelvis was performed following the standard protocol without IV contrast. COMPARISON:  May 28, 2020, July 03, 2018 FINDINGS: CT CHEST FINDINGS Cardiovascular: RIGHT IJ CVC tip terminates in the SVC just at the cavoatrial junction. Trace pericardial fluid. Aortic valve calcifications. Three-vessel coronary artery atherosclerotic calcifications. Heart is at the upper limits of normal in size. Atherosclerotic calcifications of the aorta. Mediastinum/Nodes: Visualized thyroid is unremarkable. No axillary or mediastinal adenopathy. Lungs/Pleura: No pleural effusion or pneumothorax. Centrilobular ground-glass opacity of the RIGHT lower lobe (series 5, image 92). Musculoskeletal: Remote RIGHT anterior fifth rib fracture. There are multiple small lucent lesions throughout the axial and appendicular skeleton on a background diffuse sclerosis. CT ABDOMEN PELVIS FINDINGS Hepatobiliary: Unremarkable noncontrast appearance of the liver. Status post cholecystectomy. No extrahepatic biliary ductal dilation. Pancreas: No peripancreatic fat stranding. Spleen: Unremarkable. Adrenals/Urinary Tract: Adrenal glands are unremarkable. Atrophic kidneys. Multiple bilateral incompletely assessed hypodense renal lesions. Favored bilateral nonobstructive  nephrolithiasis as well as vascular calcifications. Bladder is decompressed. Stomach/Bowel: No evidence of bowel obstruction. There is extensive diverticulosis. Normal appendix. Similar appearance of mild bowel wall thickening of the rectosigmoid colon. No new adjacent fat stranding. Vascular/Lymphatic: Atherosclerotic calcifications. No new suspicious lymphadenopathy. Reproductive: Prostate is present. Other: No free air or free fluid. Musculoskeletal: Transitional anatomy with bilateral assimilation joints at L5-S1. There are multiple small lucent lesions throughout the axial and appendicular skeleton on a background of diffuse sclerosis. These have increased in conspicuity since more remote prior studies. IMPRESSION: 1. Pancolonic diverticulosis without definitive evidence of acute diverticulitis. Again noted is circumferential bowel wall thickening of the rectosigmoid colon. This could reflect chronic inflammation or malignancy. Recommend correlation with colon cancer screening history. 2. Small area of centrilobular ground-glass opacity in the RIGHT lower lobe, likely infectious or inflammatory in etiology. 3. Innumerable lucent lesions throughout the axial and appendicular skeleton superimposed on a background of diffuse sclerosis. 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 Differential considerations include sequela of hyperparathyroidism, multiple myeloma or less likely metastatic disease. Recommend correlation with laboratory values. Aortic Atherosclerosis (ICD10-I70.0). Electronically Signed   By: Valentino Saxon MD   On: 06/12/2020 16:59    Procedures Procedures   Medications Ordered in ED Medications  ondansetron [facility]) injection 4 mg (has no administration in time range)  LORazepam (ATIVAN) injection 1 mg (has no administration in time range)  heparin injection 5,000 Units (5,000 Units Subcutaneous Given 06/12/20 2309)  azithromycin (ZITHROMAX) 500 mg in sodium chloride 0.9 %  250 mL IVPB (has no administration in time range)  cefTRIAXone (ROCEPHIN) 1 g in sodium chloride 0.9 % 100 mL IVPB (has no administration in time range)  carvedilol (COREG) tablet 25 mg (25 mg Oral Given 06/12/20 2305)  amLODipine (NORVASC) tablet 10 mg (10 mg Oral Given 06/12/20 2304)  atorvastatin (LIPITOR) tablet  40 mg (40 mg Oral Given 06/12/20 2305)  buPROPion (WELLBUTRIN XL) 24 hr tablet 300 mg (has no administration in time range)  aspirin EC tablet 81 mg (81 mg Oral Given 06/12/20 2305)  furosemide (LASIX) tablet 20 mg (20 mg Oral Given 06/12/20 2305)  lactulose (CHRONULAC) 10 GM/15ML solution 30 g (has no administration in time range)  iron polysaccharides (NIFEREX) capsule 150 mg (has no administration in time range)  famotidine (PEPCID) tablet 20 mg (20 mg Oral Given 06/12/20 2304)  escitalopram (LEXAPRO) tablet 20 mg (20 mg Oral Given 06/12/20 2304)  pantoprazole (PROTONIX) EC tablet 40 mg (40 mg Oral Given 06/12/20 2305)  senna-docusate (Senokot-S) tablet 2 tablet (has no administration in time range)  sevelamer carbonate (RENVELA) tablet 1,600 mg (has no administration in time range)  ondansetron (ZOFRAN-ODT) disintegrating tablet 4 mg (has no administration in time range)  latanoprost (XALATAN) 0.005 % ophthalmic solution 1 drop (has no administration in time range)  sevelamer carbonate (RENVELA) tablet 1,600 mg (has no administration in time range)  umeclidinium bromide (INCRUSE ELLIPTA) 62.5 MCG/INH 1 puff (1 puff Inhalation Given 06/12/20 2308)    And  fluticasone furoate-vilanterol (BREO ELLIPTA) 200-25 MCG/INH 1 puff (1 puff Inhalation Given 06/12/20 2308)  acetaminophen (TYLENOL) tablet 650 mg (has no administration in time range)  ondansetron (ZOFRAN) injection 4 mg (4 mg Intravenous Given 06/12/20 1715)  sodium chloride 0.9 % bolus 500 mL (0 mLs Intravenous Stopped 06/12/20 1902)  fentaNYL (SUBLIMAZE) injection 50 mcg (50 mcg Intravenous Given 06/12/20 1715)  cefTRIAXone (ROCEPHIN)  1 g in sodium chloride 0.9 % 100 mL IVPB (0 g Intravenous Stopped 06/12/20 2046)  azithromycin (ZITHROMAX) 500 mg in sodium chloride 0.9 % 250 mL IVPB (0 mg Intravenous Stopped 06/12/20 2211)  metoCLOPramide (REGLAN) injection 10 mg (10 mg Intravenous Given 06/12/20 2007)  pantoprazole (PROTONIX) injection 40 mg (40 mg Intravenous Given 06/12/20 2007)  methocarbamol (ROBAXIN) tablet 500 mg (500 mg Oral Given 06/12/20 2006)    ED Course  I have reviewed the triage vital signs and the nursing notes.  Pertinent labs & imaging results that were available during my care of the patient were reviewed by me and considered in my medical decision making (see chart for details).    MDM Rules/Calculators/A&P                         55 y.o. male presents to the ED with complaints of chest pain, shortness of breath, abdominal pain and vomiting, as well as muscle aches, this involves an extensive number of treatment options, and is a complaint that carries with it a high risk of complications and morbidity.    On arrival pt is chronically ill appearing. Exam significant for Some crackles and decreased air movement, Generalized abdominal pain  Additional history obtained from chart review. Previous records obtained and reviewed via EMR  I ordered pain and nausea medication and a small fluid bolus  Lab Tests:  I Ordered, reviewed, and interpreted labs, which included:  CBC: No leukocytosis, chronic anemia at baseline CMP: no electrolyte derrangements, Cr 5.75, BUN 29 Trop: 34 CK: No elevated in setting of diffuse muscle aches Lipase: WNL Lactic: WNL Blood Cultures pending  Imaging Studies ordered:  I ordered imaging studies which included CXR, CT chest abdomen and pelvis without contrast, I independently visualized and interpreted imaging which showed: CXR: no active cardiopulmonary disease CT:  Pancolonic diverticulosis without definitive evidence of acute diverticulitis. Again noted is  circumferential  bowel wall thickening of the rectosigmoid colon. This could reflect chronic inflammation or malignancy.  Small area of centrilobular ground-glass opacity in the RIGHT lower lobe, likely infectious or inflammatory in etiology.   ED Course:   ABX started for pneumonia, in setting of emesis concern for potential aspiration, and with ongoing emesis, pt unable to tolerate PO antibiotics, will require admission.  I consulted Dr. Alcario Drought with [facility] and discussed lab and imaging findings, he will see and admit the patient.  Portions of this note were generated with [employer]. Dictation errors may occur despite best attempts at proofreading.    Final Clinical Impression(s) / ED Diagnoses Final diagnoses:  Pneumonia of right lower lobe due to infectious organism  Intractable vomiting with nausea, unspecified vomiting type  Generalized abdominal pain    Rx / DC Orders ED Discharge Orders    None       Janet Berlin 06/20/20 2115    Dorie Rank, MD 06/21/20 1303

## 2020-06-12 NOTE — H&P (Signed)
History and Physical    Austin Shepard [id number] DOB: 08-Jun-1965 DOA: 06/12/2020  PCP: Beckie Salts, MD  Patient coming from: Home  I have personally briefly reviewed patient's old medical records in [place]  Chief Complaint: Abd pain, N/V  HPI: Austin Shepard is a 55 y.o. male with medical history significant of ESRD on dialysis MWF, missed Friday but had dialysis today.  ESRD occurring in context of HTN nephropathy from long history of malignant hypertension.  Has h/o mild DM2 but not on any medications for this.  Has h/o chronic abd pain, N/V episodes.  Work up by GI in March including EGD + colonoscopy.  GI came to apparent conclusion that pt had painful diverticular disease, wanted to follow up outpt to discuss possible elective hemicolectomy.  Did not want patient to continue on narcotics.  For more details please see the documentation during the 03/22/2020 [facility] hospital admission.  Pt presents to ED with c/o N/V for past 4-5 days with associated abd pain, this typical of his flare up of abd pain symptoms that he has seen him in the ED and/or admitted 4 prior times since March.  He also has CP and SOB for past couple of days which is new.  Associated subjective fevers.  No constipation.  Has neck pain just today, pain shooting down his back with movement of neck.   ED Course: CT abd/pelvis -> RLL PNA, also has lucent lesions in skeleton that radiologist thinks reflects renal osteodystrophy.  Also pancolonic diverticulosis, circumferential bowel wall thickening of the rectosigmoid colon. This could reflect chronic inflammation or malignancy.  Given rocephin + azithro for PNA.  Hospitalist asked to admit due to not tolerating POs.  Review of Systems: As per HPI, otherwise all review of systems negative.  Past Medical History:  Diagnosis Date  . Anxiety   . Chronic kidney disease    STAGE 1   . Coronary artery disease   . Depression   . Diabetes mellitus   .  Headache(784.0)   . Hypertension   . Myocardial infarction ([place])   . Seizures ([place])   . Shortness of breath   . Sleep apnea    CPAP  . Stroke [facility])     Past Surgical History:  Procedure Laterality Date  . ESOPHAGOGASTRODUODENOSCOPY N/A 05/29/2012   Procedure: ESOPHAGOGASTRODUODENOSCOPY (EGD);  Surgeon: Beryle Beams, MD;  Location[facility] ENDOSCOPY;  Service: Endoscopy;  Laterality: N/A;  . LEG SURGERY     COMPARTMENT SYNDROME      reports that he has been smoking cigarettes. He has a 17.50 pack-year smoking history. He has never used smokeless tobacco. He reports previous alcohol use. He reports previous drug use.  Allergies  Allergen Reactions  . Depakote [Divalproex Sodium]   . Ivp Dye [Iodinated Diagnostic Agents]   . Keflex [Cephalexin]     Got ANCEF 2 weeks ago with no allergic rxn. Getting Rocephin in ED right now...  . Other     Seroquil     Family History  Problem Relation Age of Onset  . Dementia Mother   . Seizures Brother   . Stroke Paternal Grandmother   . Stroke Paternal Grandfather      Prior to Admission medications   Medication Sig Start Date End Date Taking? Authorizing Provider  busPIRone (BUSPAR) 10 MG tablet Take 10 mg by mouth 3 (three) times daily.    [provider]  carbamazepine (TEGRETOL) 200 MG tablet Take 400 mg by mouth 2 (two) times  daily.    [provider]  clonazePAM (KLONOPIN) 2 MG tablet Take 2 mg by mouth at bedtime.    [provider]  clopidogrel (PLAVIX) 75 MG tablet Take 75 mg by mouth daily.    [provider]  gabapentin (NEURONTIN) 300 MG capsule Take 600 mg by mouth 3 (three) times daily.    [provider]  Glucose Blood (ACCU-CHEK AVIVA VI) 1 strip by In Vitro route every morning.    [provider]  labetalol (NORMODYNE) 200 MG tablet Take 1 tablet (200 mg total) by mouth 2 (two) times daily. 05/30/12   Cherene Altes, MD  lamoTRIgine (LAMICTAL) 200 MG tablet Take 200  mg by mouth 2 (two) times daily.    [provider]  LANCETS ULTRA THIN 78H MISC 1 application by Does not apply route daily.    [provider]  nitroGLYCERIN (NITROSTAT) 0.4 MG SL tablet Place 0.4 mg under the tongue as needed for chest pain.    [provider]  oxyCODONE-acetaminophen (PERCOCET/ROXICET) 5-325 MG per tablet Take 1 tablet by mouth every 8 (eight) hours as needed for pain.    [provider]  pantoprazole (PROTONIX) 40 MG tablet Take 40 mg by mouth daily.    [provider]  rizatriptan (MAXALT-MLT) 10 MG disintegrating tablet Take 10 mg by mouth as needed for migraine. May repeat in 2 hours if needed. Maximum 3 tablets in 24 hours.    [provider]  tolterodine (DETROL) 2 MG tablet Take 2 mg by mouth every 12 (twelve) hours.    [provider]  topiramate (TOPAMAX) 50 MG tablet Take 50 mg by mouth 2 (two) times daily.    [provider]    Physical Exam: Vitals:   06/12/20 1700 06/12/20 1702 06/12/20 1832 06/12/20 1915  BP: (!) 124/55  (!) 152/69 (!) 154/81  Pulse: 90  84 85  Resp: (!) 28  (!) 22 (!) 26  Temp:  98.2 F (36.8 C)    TempSrc:  Oral    SpO2: 100%  97% 95%    Constitutional: NAD, calm, comfortable Eyes: PERRL, lids and conjunctivae normal ENMT: Mucous membranes are moist. Posterior pharynx clear of any exudate or lesions.Normal dentition.  Neck: normal, supple, no masses, no thyromegaly Respiratory: clear to auscultation bilaterally, no wheezing, no crackles. Normal respiratory effort. No accessory muscle use.  Cardiovascular: Regular rate and rhythm, no murmurs / rubs / gallops. No extremity edema. 2+ pedal pulses. No carotid bruits.  Abdomen: no tenderness, no masses palpated. No hepatosplenomegaly. Bowel sounds positive.  Musculoskeletal: no clubbing / cyanosis. No joint deformity upper and lower extremities. Good ROM, no contractures. Normal muscle tone.  Skin: no rashes,  lesions, ulcers. No induration Neurologic: CN 2-12 grossly intact. Sensation intact, DTR normal. Strength 5/5 in all 4.  Psychiatric: Normal judgment and insight. Alert and oriented x 3. Normal mood.    Labs on Admission: I have personally reviewed following labs and imaging studies  CBC: Recent Labs  Lab 06/12/20 1731  WBC 7.9  NEUTROABS 5.7  HGB 9.4*  HCT 28.6*  MCV 94.7  PLT 885   Basic Metabolic Panel: Recent Labs  Lab 06/12/20 1731  NA 135  K 4.0  CL 98  CO2 25  GLUCOSE 91  BUN 29*  CREATININE 5.75*  CALCIUM 9.3   GFR: CrCl cannot be calculated (Unknown ideal weight.). Liver Function Tests: Recent Labs  Lab 06/12/20 1731  AST 13*  ALT 12  ALKPHOS 68  BILITOT 0.4  PROT 6.7  ALBUMIN 3.0*   Recent Labs  Lab 06/12/20 1731  LIPASE 35   No results for input(s): AMMONIA in the last 168 hours. Coagulation Profile: No results for input(s): INR, PROTIME in the last 168 hours. Cardiac Enzymes: Recent Labs  Lab 06/12/20 1731  CKTOTAL 35*   BNP (last 3 results) No results for input(s): PROBNP in the last 8760 hours. HbA1C: No results for input(s): HGBA1C in the last 72 hours. CBG: No results for input(s): GLUCAP in the last 168 hours. Lipid Profile: No results for input(s): CHOL, HDL, LDLCALC, TRIG, CHOLHDL, LDLDIRECT in the last 72 hours. Thyroid Function Tests: No results for input(s): TSH, T4TOTAL, FREET4, T3FREE, THYROIDAB in the last 72 hours. Anemia Panel: No results for input(s): VITAMINB12, FOLATE, FERRITIN, TIBC, IRON, RETICCTPCT in the last 72 hours. Urine analysis: No results found for: COLORURINE, APPEARANCEUR, [place], [place], [place], [place], BILIRUBINUR, KETONESUR, PROTEINUR, UROBILINOGEN, NITRITE, LEUKOCYTESUR  Radiological Exams on Admission: DG Chest 2 View  Result Date: 06/12/2020 CLINICAL DATA:  Chest pain, shortness of breath EXAM: CHEST - 2 VIEW COMPARISON:  05/28/2020 FINDINGS: Dual lumen right-sided central venous catheter  in unchanged position. No focal consolidation. No pleural effusion or pneumothorax. Heart and mediastinal contours are unremarkable. No acute osseous abnormality. IMPRESSION: No active cardiopulmonary disease. Electronically Signed   By: Kathreen Devoid   On: 06/12/2020 15:38   CT CHEST ABDOMEN PELVIS WO CONTRAST  Result Date: 06/12/2020 CLINICAL DATA:  Pain EXAM: CT CHEST, ABDOMEN AND PELVIS WITHOUT CONTRAST TECHNIQUE: Multidetector CT imaging of the chest, abdomen and pelvis was performed following the standard protocol without IV contrast. COMPARISON:  May 28, 2020, July 03, 2018 FINDINGS: CT CHEST FINDINGS Cardiovascular: RIGHT IJ CVC tip terminates in the SVC just at the cavoatrial junction. Trace pericardial fluid. Aortic valve calcifications. Three-vessel coronary artery atherosclerotic calcifications. Heart is at the upper limits of normal in size. Atherosclerotic calcifications of the aorta. Mediastinum/Nodes: Visualized thyroid is unremarkable. No axillary or mediastinal adenopathy. Lungs/Pleura: No pleural effusion or pneumothorax. Centrilobular ground-glass opacity of the RIGHT lower lobe (series 5, image 92). Musculoskeletal: Remote RIGHT anterior fifth rib fracture. There are multiple small lucent lesions throughout the axial and appendicular skeleton on a background diffuse sclerosis. CT ABDOMEN PELVIS FINDINGS Hepatobiliary: Unremarkable noncontrast appearance of the liver. Status post cholecystectomy. No extrahepatic biliary ductal dilation. Pancreas: No peripancreatic fat stranding. Spleen: Unremarkable. Adrenals/Urinary Tract: Adrenal glands are unremarkable. Atrophic kidneys. Multiple bilateral incompletely assessed hypodense renal lesions. Favored bilateral nonobstructive nephrolithiasis as well as vascular calcifications. Bladder is decompressed. Stomach/Bowel: No evidence of bowel obstruction. There is extensive diverticulosis. Normal appendix. Similar appearance of mild bowel wall thickening  of the rectosigmoid colon. No new adjacent fat stranding. Vascular/Lymphatic: Atherosclerotic calcifications. No new suspicious lymphadenopathy. Reproductive: Prostate is present. Other: No free air or free fluid. Musculoskeletal: Transitional anatomy with bilateral assimilation joints at L5-S1. There are multiple small lucent lesions throughout the axial and appendicular skeleton on a background of diffuse sclerosis. These have increased in conspicuity since more remote prior studies. IMPRESSION: 1. Pancolonic diverticulosis without definitive evidence of acute diverticulitis. Again noted is circumferential bowel wall thickening of the rectosigmoid colon. This could reflect chronic inflammation or malignancy. Recommend correlation with colon cancer screening history. 2. Small area of centrilobular ground-glass opacity in the RIGHT lower lobe, likely infectious or inflammatory in etiology. 3. Innumerable lucent lesions throughout the axial and appendicular skeleton superimposed on a background of diffuse sclerosis. This likely reflects underlying renal  osteodystrophy. Differential considerations include sequela of hyperparathyroidism, multiple myeloma or less likely metastatic disease. Recommend correlation with laboratory values. Aortic Atherosclerosis (ICD10-I70.0). Electronically Signed   By: Valentino Saxon MD   On: 06/12/2020 16:59    EKG: Independently reviewed.  Assessment/Plan Principal Problem:   Right lower lobe pneumonia Active Problems:   Malignant hypertension   DM type 2 (diabetes mellitus, type 2) (HCC)   History of drug abuse ([place])   ESRD (end stage renal disease) (HCC)   Chronic abdominal pain   Nausea & vomiting   Neck pain    1. RLL PNA - 1. PNA pathway 2. Empiric rocephin + azithro 3. MRSA PCR nares 2. Abd pain, N/V - 1. Chronic and ongoing issue, multiple ED visits / admits since March. 2. See March 9th admission for GI work up and discussion: but after EGD +  colonoscopy: GI thinks he has painful diverticulosis as cause, recd outpt follow up to discuss possible elective hemicolectomy. 1. I see the circumferential thickening of colon on CT today: no mention of mass or colon CA on colonoscopy done as recently as March this year. 3. zofran PRN 4. Add reglan if needed 5. Avoid narcotics as recommended by GI at [facility] 1. Also not a great idea as a long term med in a patient who is a recovering addict. 3. Lucent lesions of bones on CT today - 1. ? Renal ostodystrophy 2. Though pt only on dialysis since Feb 3. Might want to discuss this further with nephrology in AM 4. Neck pain - 1. Dont like the neck pain in pt with lucent lesions of bone on CT. 2. Will go ahead and get MRI 5. ESRD - 1. Call nephro in AM for routine IP dialysis during stay. 6. HTN - 1. Cont home BP meds 7. DM2 - 1. Well controlled, on no meds, BGL only 91 today.    DVT prophylaxis: Heparin [place] Code Status: Full Family Communication: No family in room Disposition Plan: Home after tolerating POs Consults called: None, call nephro in AM Admission status: Place in obs     Anatole Apollo, [place] [facility]  How to contact the [facility] Attending or Consulting provider [place] or covering provider during after hours [place], for this patient?  1. Check the care team in [facility] and look for a) attending/consulting [facility] provider listed and b) the [facility] team listed 2. Log into [email or web address]  Amion Physician Scheduling and messaging for groups and whole hospitals  On call and physician scheduling software for group practices, residents, hospitalists and other medical providers for call, clinic, rotation and shift schedules. On[employer] is a hospital-wide system for scheduling doctors and paging doctors on call. EasyPlot is for scientific plotting and data analysis.  [email or web address]  and use [place]'s universal password to access. If you do not have the password, please contact the  hospital operator.  3. Locate the [facility] provider you are looking for under [facility] and page to a number that you can be directly reached. 4. If you still have difficulty reaching the provider, please page the [facility] (Director on Call) for the Hospitalists listed on amion for assistance.  06/12/2020, 8:56 PM

## 2020-06-12 NOTE — ED Notes (Signed)
Pt refuses sticks and is insistent on having IV team consult with ultrasound.

## 2020-06-12 NOTE — ED Notes (Signed)
Patient transported to MRI 

## 2020-06-13 ENCOUNTER — Encounter (HOSPITAL_COMMUNITY): Payer: Self-pay | Admitting: Internal Medicine

## 2020-06-13 ENCOUNTER — Other Ambulatory Visit: Payer: Self-pay

## 2020-06-13 DIAGNOSIS — R1084 Generalized abdominal pain: Secondary | ICD-10-CM

## 2020-06-13 DIAGNOSIS — E119 Type 2 diabetes mellitus without complications: Secondary | ICD-10-CM

## 2020-06-13 DIAGNOSIS — N186 End stage renal disease: Secondary | ICD-10-CM | POA: Diagnosis not present

## 2020-06-13 DIAGNOSIS — R112 Nausea with vomiting, unspecified: Secondary | ICD-10-CM | POA: Diagnosis not present

## 2020-06-13 LAB — MRSA PCR SCREENING: MRSA by PCR: NEGATIVE

## 2020-06-13 MED ORDER — DICLOFENAC SODIUM 1 % EX GEL
4.0000 g | Freq: Four times a day (QID) | CUTANEOUS | Status: DC
  Administered 2020-06-13 (×3): 4 g via TOPICAL
  Filled 2020-06-13: qty 100

## 2020-06-13 MED ORDER — SIMETHICONE 80 MG PO CHEW
80.0000 mg | CHEWABLE_TABLET | Freq: Four times a day (QID) | ORAL | Status: AC
  Administered 2020-06-13 (×4): 80 mg via ORAL
  Filled 2020-06-13 (×4): qty 1

## 2020-06-13 MED ORDER — DICLOFENAC SODIUM 1 % EX GEL: 4.0000 g | Freq: Four times a day (QID) | CUTANEOUS | Status: DC

## 2020-06-13 MED ORDER — CHLORHEXIDINE GLUCONATE CLOTH 2 % EX PADS
6.0000 | MEDICATED_PAD | Freq: Every day | CUTANEOUS | Status: DC
  Administered 2020-06-13: 6 via TOPICAL

## 2020-06-13 MED ORDER — SACCHAROMYCES BOULARDII 250 MG PO CAPS
250.0000 mg | ORAL_CAPSULE | Freq: Two times a day (BID) | ORAL | Status: DC
  Administered 2020-06-13 – 2020-06-14 (×3): 250 mg via ORAL
  Filled 2020-06-13 (×3): qty 1

## 2020-06-13 NOTE — ED Notes (Signed)
Returned from MRI 

## 2020-06-13 NOTE — Progress Notes (Signed)
Patient ID: Austin Shepard, male   DOB: 1965/12/20, 55 y.o.   MRN: [id number] I am aware of the patient, formal consult will be done tomorrow. He has hemodialysis ordered for tomorrow.  Elmarie Shiley MD [facility]. Office # [phone] Pager # [phone] 4:49 PM

## 2020-06-13 NOTE — Progress Notes (Signed)
Obtained reported from [place] in the ED. Awaiting patient from ED.

## 2020-06-13 NOTE — ED Notes (Signed)
Called 74M for report, bed not approved at this time, call back info let with floor

## 2020-06-13 NOTE — Progress Notes (Signed)
New Admission Note:  Arrival Method: Stretcher Mental Orientation: Alert and oriented x 4 Telemetry: Box 20 Assessment: Completed Skin: Warm and dry IV: NSL Pain: Denies Tubes: N/A Safety Measures: Safety Fall Prevention Plan initiated.  Admission: Completed 5 M  Orientation: Patient has been orientated to the room, unit and the staff. Welcome booklet given.  Family: N/A  Orders have been reviewed and implemented. Will continue to monitor the patient. Call light has been placed within reach and bed alarm has been activated.   Sima Matas BSN, RN  Phone Number: [phone]

## 2020-06-13 NOTE — Progress Notes (Signed)
[facility]  PROGRESS NOTE  Austin Shepard XTG:[id number] DOB: 1965-12-29 DOA: 06/12/2020 PCP: Beckie Salts, MD   Brief HPI:   55 year old male with medical history of ESRD on dialysis MWF, missed dialysis on Friday, hypertensive nephropathy from longstanding history of malignant hypertension, diabetes mellitus type 2, has chronic abdominal pain, nausea and vomiting.  Patient had work-up by GI at [facility] which included EGD and colonoscopy.  GI said that patient had painful diverticular disease wanted to follow-up outpatient to discuss possible elective hemicolectomy.  Patient is supposed to follow-up with GI as outpatient.  He came to ED with complaints of nausea and vomiting for 4 to 5 days with associated abdominal pain.  CT abdomen/pelvis showed right lower lobe pneumonia, pancolonic diverticulosis, circumferential bowel wall thickening of the rectosigmoid colon.  This could reflect chronic inflammation or malignancy.  Innumerable lucent lesion throughout the axial and appendicular skeleton superimposed on background of diffuse sclerosis this could reflect underlying renal osteodystrophy.  Differential includes sequela of hyperparathyroidism, multiple myeloma or metastasis.    Subjective   Patient seen and examined, complains of abdominal distention.   Assessment/Plan:     Abdominal distention/pain -CT scan shows pandiverticulosis with bowel wall thickening -Recently had colonoscopy and EGD at [facility] -GI felt that patient has painful diverticulosis and will need hemicolectomy; he was supposed to follow GI as outpatient. -Colonoscopy done in March did not mention anything about malignancy -Continue Zofran as needed, start simethicone 80 mg 4 times daily, Florastor to 50 mg p.o. twice daily   Right lower lobe pneumonia -Seen on CT abdomen/pelvis -Continue ceftriaxone and Zithromax -Blood cultures negative to date; follow blood culture results   Lucent lesions on axial  skeleton -CT abdomen pelvis shows lucent lesions on axial and appendicular skeleton -Renal osteodystrophy versus myeloma -Nephrology is aware and will make recommendation    ESRD -Nephrology consulted for hemodialysis   Hypertension -Blood pressure controlled -Continue Coreg, furosemide, amlodipine   Diabetes mellitus type 2 -CBG well controlled -Not on medications at home      Scheduled medications:   . amLODipine  10 mg Oral Daily  . aspirin EC  81 mg Oral Daily  . atorvastatin  40 mg Oral QHS  . buPROPion  300 mg Oral Daily  . carvedilol  25 mg Oral BID  . Chlorhexidine Gluconate Cloth  6 each Topical Daily  . diclofenac Sodium  4 g Topical QID  . escitalopram  20 mg Oral Daily  . famotidine  20 mg Oral Daily  . umeclidinium bromide  1 puff Inhalation Daily   And  . fluticasone furoate-vilanterol  1 puff Inhalation Daily  . furosemide  20 mg Oral Daily  . heparin  5,000 Units Subcutaneous Q8H  . [START ON 06/14/2020] iron polysaccharides  150 mg Oral Q M,W,F-1800  . latanoprost  1 drop Both Eyes QHS  . pantoprazole  40 mg Oral Daily  . saccharomyces boulardii  250 mg Oral BID  . sevelamer carbonate  1,600 mg Oral TID with meals  . sevelamer carbonate  1,600 mg Oral With snacks  . simethicone  80 mg Oral QID         Data Reviewed:   CBG:  No results for input(s): GLUCAP in the last 168 hours.  SpO2: 92 %    Vitals:   06/13/20 0400 06/13/20 0528 06/13/20 0920 06/13/20 1343  BP: 134/78 122/61 (!) 151/71 105/61  Pulse: 66 69 77 68  Resp: 16 20 18 20   Temp:  97.8 F (36.6 C) 99.4 F (37.4 C) 98.6 F (37 C)  TempSrc:  Oral Oral   SpO2: 93% 94% 96% 92%  Weight:  116.8 kg    Height:  5' 4"  (1.626 m)       Intake/Output Summary (Last 24 hours) at 06/13/2020 1639 Last data filed at 06/13/2020 1300 Gross per 24 hour  Intake 1651.67 ml  Output 0 ml  Net 1651.67 ml    05/29 1901 - 05/31 0700 In: 1091.7  Out: 0   Filed Weights   06/13/20  0528  Weight: 116.8 kg    CBC:  Recent Labs  Lab 06/12/20 1731  WBC 7.9  HGB 9.4*  HCT 28.6*  PLT 287  MCV 94.7  MCH 31.1  MCHC 32.9  RDW 14.1  LYMPHSABS 1.0  MONOABS 1.0  EOSABS 0.1  BASOSABS 0.0    Complete metabolic panel:  Recent Labs  Lab 06/12/20 1731 06/12/20 1830  NA 135  --   K 4.0  --   CL 98  --   CO2 25  --   GLUCOSE 91  --   BUN 29*  --   CREATININE 5.75*  --   CALCIUM 9.3  --   AST 13*  --   ALT 12  --   ALKPHOS 68  --   BILITOT 0.4  --   ALBUMIN 3.0*  --   LATICACIDVEN 0.9 0.7    Recent Labs  Lab 06/12/20 1731  LIPASE 35    Recent Labs  Lab 06/12/20 1609  SARSCOV2NAA NEGATIVE    ------------------------------------------------------------------------------------------------------------------ No results for input(s): CHOL, HDL, LDLCALC, TRIG, CHOLHDL, LDLDIRECT in the last 72 hours.  Lab Results  Component Value Date   HGBA1C 5.4 05/28/2012   ------------------------------------------------------------------------------------------------------------------ No results for input(s): TSH, T4TOTAL, T3FREE, THYROIDAB in the last 72 hours.  Invalid input(s): FREET3 ------------------------------------------------------------------------------------------------------------------ No results for input(s): VITAMINB12, FOLATE, FERRITIN, TIBC, IRON, RETICCTPCT in the last 72 hours.  Coagulation profile No results for input(s): INR, PROTIME in the last 168 hours. No results for input(s): DDIMER in the last 72 hours.  Cardiac Enzymes Recent Labs  Lab 06/12/20 1731  CKTOTAL 35*    ------------------------------------------------------------------------------------------------------------------ No results found for: BNP   Antibiotics: Anti-infectives (From admission, onward)   Start     Dose/Rate Route Frequency Ordered Stop   06/13/20 2100  azithromycin (ZITHROMAX) 500 mg in sodium chloride 0.9 % 250 mL IVPB        500 mg 250  mL/hr over 60 Minutes Intravenous Every 24 hours 06/12/20 2046 06/18/20 2059   06/13/20 2100  cefTRIAXone (ROCEPHIN) 1 g in sodium chloride 0.9 % 100 mL IVPB        1 g 200 mL/hr over 30 Minutes Intravenous Every 24 hours 06/12/20 2046 06/18/20 2059   06/12/20 1930  cefTRIAXone (ROCEPHIN) 1 g in sodium chloride 0.9 % 100 mL IVPB        1 g 200 mL/hr over 30 Minutes Intravenous  Once 06/12/20 1917 06/12/20 2046   06/12/20 1930  azithromycin (ZITHROMAX) 500 mg in sodium chloride 0.9 % 250 mL IVPB        500 mg 250 mL/hr over 60 Minutes Intravenous  Once 06/12/20 1917 06/12/20 2211       Radiology Reports  DG Chest 2 View  Result Date: 06/12/2020 CLINICAL DATA:  Chest pain, shortness of breath EXAM: CHEST - 2 VIEW COMPARISON:  05/28/2020 FINDINGS: Dual lumen right-sided central venous catheter in unchanged position. No focal consolidation.  No pleural effusion or pneumothorax. Heart and mediastinal contours are unremarkable. No acute osseous abnormality. IMPRESSION: No active cardiopulmonary disease. Electronically Signed   By: Kathreen Devoid   On: 06/12/2020 15:38   MR CERVICAL SPINE WO CONTRAST  Result Date: 06/13/2020 CLINICAL DATA:  Initial evaluation for cervical radiculopathy. EXAM: MRI CERVICAL SPINE WITHOUT CONTRAST TECHNIQUE: Multiplanar, multisequence MR imaging of the cervical spine was performed. No intravenous contrast was administered. COMPARISON:  None available. FINDINGS: Alignment: Straightening with slight reversal of the normal cervical lordosis, apex at C5-6. No listhesis. Vertebrae: Vertebral body height maintained without acute or chronic fracture. Bone marrow signal intensity diffusely decreased on T1 weighted imaging, nonspecific, but likely related to patient history of end-stage renal disease and anemia. No discrete or worrisome osseous lesions. Discogenic reactive endplate change present about the C5-6 interspace. Reactive marrow edema present about the right C2-3 facet due  to facet arthritis (series 7, image 3). Finding could serve as a source for neck pain. No other abnormal marrow edema. Cord: Signal intensity within the cervical spinal cord grossly within normal limits on this motion degraded exam. Normal cord caliber morphology. Posterior Fossa, vertebral arteries, paraspinal tissues: Visualized brain and posterior fossa within normal limits. Mild sphenoid sinus mucosal thickening noted. Craniocervical junction normal. Soft tissue edema noted adjacent to the inflamed right C2-3 facet. Paraspinous and prevertebral soft tissues demonstrate no other acute finding. Normal flow voids seen within the vertebral arteries bilaterally. Strongly dominant left vertebral artery with diffusely hypoplastic right vertebral artery noted. Disc levels: C2-C3: Small central disc protrusion mildly indents the ventral thecal sac. Right-sided facet arthrosis with associated reactive marrow edema. No significant spinal stenosis. Foramina remain patent. C3-C4: Minimal disc bulge with uncovertebral hypertrophy. Right worse than left facet degeneration. No significant spinal stenosis. Foramina remain patent. C4-C5: Degenerative intervertebral disc space narrowing. Central disc protrusion indents the ventral thecal sac, contacting the ventral cord. Mild spinal stenosis. Right worse than left uncovertebral spurring with resultant mild right C5 foraminal stenosis. Left neural foramina remains patent. C5-C6: Degenerative intervertebral disc space narrowing with diffuse disc osteophyte complex, eccentric to the right. Broad posterior component flattens and effaces the ventral thecal sac. Resultant mild-to-moderate spinal stenosis with mild cord flattening, but no appreciable cord signal changes. Moderate right worse than left C6 foraminal narrowing. C6-C7: Degenerative intervertebral disc space narrowing with diffuse disc osteophyte. Broad posterior component flattens and partially effaces the ventral thecal sac  without significant spinal stenosis. Foramina remain patent. C7-T1: Negative interspace. Right-sided facet hypertrophy. No stenosis. Visualized upper thoracic spine demonstrates no significant finding. IMPRESSION: 1. Right eccentric disc osteophyte complex at C5-6 with resultant mild to moderate spinal stenosis, with moderate right worse than left C6 foraminal narrowing. 2. Central disc protrusion at C4-5 with resultant mild spinal stenosis. 3. Reactive marrow edema about the right C2-3 facet due to facet arthritis. Finding could serve as a source for neck pain. 4. Additional degenerative C6-7 without significant stenosis or impingement. Electronically Signed   By: Jeannine Boga M.D.   On: 06/13/2020 01:48   CT CHEST ABDOMEN PELVIS WO CONTRAST  Result Date: 06/12/2020 CLINICAL DATA:  Pain EXAM: CT CHEST, ABDOMEN AND PELVIS WITHOUT CONTRAST TECHNIQUE: Multidetector CT imaging of the chest, abdomen and pelvis was performed following the standard protocol without IV contrast. COMPARISON:  May 28, 2020, July 03, 2018 FINDINGS: CT CHEST FINDINGS Cardiovascular: RIGHT IJ CVC tip terminates in the SVC just at the cavoatrial junction. Trace pericardial fluid. Aortic valve calcifications. Three-vessel coronary artery atherosclerotic calcifications.  Heart is at the upper limits of normal in size. Atherosclerotic calcifications of the aorta. Mediastinum/Nodes: Visualized thyroid is unremarkable. No axillary or mediastinal adenopathy. Lungs/Pleura: No pleural effusion or pneumothorax. Centrilobular ground-glass opacity of the RIGHT lower lobe (series 5, image 92). Musculoskeletal: Remote RIGHT anterior fifth rib fracture. There are multiple small lucent lesions throughout the axial and appendicular skeleton on a background diffuse sclerosis. CT ABDOMEN PELVIS FINDINGS Hepatobiliary: Unremarkable noncontrast appearance of the liver. Status post cholecystectomy. No extrahepatic biliary ductal dilation. Pancreas: No  peripancreatic fat stranding. Spleen: Unremarkable. Adrenals/Urinary Tract: Adrenal glands are unremarkable. Atrophic kidneys. Multiple bilateral incompletely assessed hypodense renal lesions. Favored bilateral nonobstructive nephrolithiasis as well as vascular calcifications. Bladder is decompressed. Stomach/Bowel: No evidence of bowel obstruction. There is extensive diverticulosis. Normal appendix. Similar appearance of mild bowel wall thickening of the rectosigmoid colon. No new adjacent fat stranding. Vascular/Lymphatic: Atherosclerotic calcifications. No new suspicious lymphadenopathy. Reproductive: Prostate is present. Other: No free air or free fluid. Musculoskeletal: Transitional anatomy with bilateral assimilation joints at L5-S1. There are multiple small lucent lesions throughout the axial and appendicular skeleton on a background of diffuse sclerosis. These have increased in conspicuity since more remote prior studies. IMPRESSION: 1. Pancolonic diverticulosis without definitive evidence of acute diverticulitis. Again noted is circumferential bowel wall thickening of the rectosigmoid colon. This could reflect chronic inflammation or malignancy. Recommend correlation with colon cancer screening history. 2. Small area of centrilobular ground-glass opacity in the RIGHT lower lobe, likely infectious or inflammatory in etiology. 3. Innumerable lucent lesions throughout the axial and appendicular skeleton superimposed on a background of diffuse sclerosis. 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 Differential considerations include sequela of hyperparathyroidism, multiple myeloma or less likely metastatic disease. Recommend correlation with laboratory values. Aortic Atherosclerosis (ICD10-I70.0). Electronically Signed   By: Valentino Saxon MD   On: 06/12/2020 16:59      DVT prophylaxis: Heparin  Code Status: Full code  Family Communication: No family at  bedside   Consultants:  Nephrology  Procedures:      Objective    Physical Examination:   General-appears in no acute distress  Heart-S1-S2, regular, no murmur auscultated  Lungs-clear to auscultation bilaterally, no wheezing or crackles auscultated  Abdomen-soft, nontender, distended,  no organomegaly  Extremities-no edema in the lower extremities  Neuro-alert, oriented x3, no focal deficit noted   Status is: Inpatient  Dispo: The patient is from: Home              Anticipated d/c is to: Home              Anticipated d/c date is: 06/15/2020              Patient currently not stable for discharge  Barrier to discharge-ongoing evaluation for abdominal pain, distention, pneumonia  COVID-19 Labs  No results for input(s): DDIMER, FERRITIN, LDH, CRP in the last 72 hours.  Lab Results  Component Value Date   [place] NEGATIVE 06/12/2020    Microbiology  Recent Results (from the past 240 hour(s))  Resp Panel by RT-PCR (Flu A&B, Covid) Nasopharyngeal Swab     Status: None   Collection Time: 06/12/20  4:09 PM   Specimen: Nasopharyngeal Swab; Nasopharyngeal(NP) swabs in vial transport medium  Result Value Ref Range Status   SARS Coronavirus 2 by RT PCR NEGATIVE NEGATIVE Final    Comment: (NOTE) SARS-CoV-2 target nucleic acids are NOT DETECTED.  The SARS-CoV-2 RNA is generally detectable in upper respiratory specimens during the acute phase of infection. The lowest  concentration of SARS-CoV-2 viral copies this assay can detect is 138 copies/mL. A negative result does not preclude SARS-Cov-2 infection and should not be used as the sole basis for treatment or other patient management decisions. A negative result may occur with  improper specimen collection/handling, submission of specimen other than nasopharyngeal swab, presence of viral mutation(s) within the areas targeted by this assay, and inadequate number of viral copies(<138 copies/mL). A negative  result must be combined with clinical observations, patient history, and epidemiological information. The expected result is Negative.  Fact Sheet for Patients:  EntrepreneurPulse.com.au  Fact Sheet for Healthcare Providers:  IncredibleEmployment.be  This test is no t yet approved or cleared by the [place] FDA and  has been authorized for detection and/or diagnosis of SARS-CoV-2 by FDA under an Emergency Use Authorization (EUA). This EUA will remain  in effect (meaning this test can be used) for the duration of the COVID-19 declaration under Section 564(b)(1) of the Act, 21 U.S.C.section 360bbb-3(b)(1), unless the authorization is terminated  or revoked sooner.       Influenza A by PCR NEGATIVE NEGATIVE Final   Influenza B by PCR NEGATIVE NEGATIVE Final    Comment: (NOTE) The Xpert Xpress SARS-CoV-2/FLU/RSV plus assay is intended as an aid in the diagnosis of influenza from Nasopharyngeal swab specimens and should not be used as a sole basis for treatment. Nasal washings and aspirates are unacceptable for Xpert Xpress SARS-CoV-2/FLU/RSV testing.  Fact Sheet for Patients: EntrepreneurPulse.com.au  Fact Sheet for Healthcare Providers: IncredibleEmployment.be  This test is not yet approved or cleared by the [place] FDA and has been authorized for detection and/or diagnosis of SARS-CoV-2 by FDA under an Emergency Use Authorization (EUA). This EUA will remain in effect (meaning this test can be used) for the duration of the COVID-19 declaration under Section 564(b)(1) of the Act, 21 U.S.C. section 360bbb-3(b)(1), unless the authorization is terminated or revoked.  Performed at [facility], [place] [address], [place], [place] [address]   Blood culture (routine x 2)     Status: None (Preliminary result)   Collection Time: 06/12/20  5:31 PM   Specimen: BLOOD  Result Value Ref Range Status    Specimen Description BLOOD SITE NOT SPECIFIED  Final   Special Requests   Final    BOTTLES DRAWN AEROBIC AND ANAEROBIC Blood Culture adequate volume   Culture   Final    NO GROWTH < 12 HOURS Performed at [facility], [place] [address] [place], [place] [address]    Report Status PENDING  Incomplete  MRSA PCR Screening     Status: None   Collection Time: 06/13/20  6:46 AM   Specimen: Nasal Mucosa; Nasopharyngeal  Result Value Ref Range Status   MRSA by PCR NEGATIVE NEGATIVE Final    Comment:        The GeneXpert MRSA Assay (FDA approved for NASAL specimens only), is one component of a comprehensive MRSA colonization surveillance program. It is not intended to diagnose MRSA infection nor to guide or monitor treatment for MRSA infections. Performed at [facility], [place] [address] [place], [place] [address]              [facility] If 7PM-7AM, please contact night-coverage at [email or web address], Office  [phone]   06/13/2020, 4:39 PM  LOS: 0 days

## 2020-06-13 NOTE — ED Notes (Signed)
Breakfast order placed ?

## 2020-06-13 NOTE — ED Notes (Signed)
Attempted report to 21M, awaiting RN to return call

## 2020-06-13 NOTE — Plan of Care (Signed)
  Problem: Education: Goal: Knowledge of General Education information will improve Description: Including pain rating scale, medication(s)/side effects and non-pharmacologic comfort measures Outcome: Completed/Met

## 2020-06-14 ENCOUNTER — Other Ambulatory Visit (HOSPITAL_COMMUNITY): Payer: Self-pay

## 2020-06-14 DIAGNOSIS — F32A Depression, unspecified: Secondary | ICD-10-CM

## 2020-06-14 DIAGNOSIS — J189 Pneumonia, unspecified organism: Secondary | ICD-10-CM | POA: Diagnosis not present

## 2020-06-14 DIAGNOSIS — R109 Unspecified abdominal pain: Secondary | ICD-10-CM

## 2020-06-14 DIAGNOSIS — N186 End stage renal disease: Secondary | ICD-10-CM

## 2020-06-14 DIAGNOSIS — R112 Nausea with vomiting, unspecified: Secondary | ICD-10-CM

## 2020-06-14 DIAGNOSIS — G8929 Other chronic pain: Secondary | ICD-10-CM

## 2020-06-14 DIAGNOSIS — F419 Anxiety disorder, unspecified: Secondary | ICD-10-CM

## 2020-06-14 DIAGNOSIS — J449 Chronic obstructive pulmonary disease, unspecified: Secondary | ICD-10-CM

## 2020-06-14 MED ORDER — AZITHROMYCIN 250 MG PO TABS
250.0000 mg | ORAL_TABLET | Freq: Every day | ORAL | 0 refills | Status: AC
  Filled 2020-06-14: qty 3, 3d supply, fill #0

## 2020-06-14 MED ORDER — AMOXICILLIN-POT CLAVULANATE 875-125 MG PO TABS
1.0000 | ORAL_TABLET | Freq: Every day | ORAL | 0 refills | Status: DC
  Filled 2020-06-14: qty 5, 5d supply, fill #0

## 2020-06-14 MED ORDER — ONDANSETRON 4 MG PO TBDP
4.0000 mg | ORAL_TABLET | Freq: Three times a day (TID) | ORAL | 0 refills | Status: AC | PRN
  Filled 2020-06-14: qty 20, 7d supply, fill #0

## 2020-06-14 MED ORDER — AMOXICILLIN-POT CLAVULANATE 500-125 MG PO TABS
500.0000 mg | ORAL_TABLET | Freq: Every day | ORAL | 0 refills | Status: AC
  Filled 2020-06-14: qty 5, 5d supply, fill #0

## 2020-06-14 NOTE — Plan of Care (Signed)
  Problem: Clinical Measurements: Goal: Diagnostic test results will improve Outcome: Completed/Met

## 2020-06-14 NOTE — Progress Notes (Signed)
DISCHARGE NOTE HOME Austin Shepard to be discharged Home per MD order. Discussed prescriptions and follow up appointments with the patient. Prescriptions given to patient; medication list explained in detail. Patient verbalized understanding.  Skin clean, dry and intact without evidence of skin break down, no evidence of skin tears noted. IV catheter discontinued intact. Site without signs and symptoms of complications. Dressing and pressure applied. Pt denies pain at the site currently. No complaints noted.  Patient free of lines, drains, and wounds.   An After Visit Summary (AVS) was printed and given to the patient. Patient escorted via wheelchair, and discharged home via private auto.  Dorthey Sawyer, RN

## 2020-06-14 NOTE — Progress Notes (Signed)
Patient is cleared for discharge. He is on the HD schedule here today, but Navigator spoke with him about going to his outpatient HD clinic today since inpt HD unit is so full and since he is able to be discharged this morning. Patient is fully agreeable to this, however, he wishes to go tomorrow to HD instead of today. He does not have his car here--his daughter has it in [place] and they need to use it for a doctor's appt today. Nephrologist agreeable to HD tomorrow at [facility] unit[facility]. Navigator witnessed patient make appointment with [facility] for tomorrow at 10:30am and states commitment to attend. He states he needs a ride home today and then will arrange getting his car back this evening from his daughter.  Navigator will arrange [facility] for patient to get home.   Alphonzo Cruise, [facility] [phone]

## 2020-06-14 NOTE — Discharge Summary (Signed)
Physician Discharge Summary  Austin Shepard OJJ:[id number] DOB: [birth year] DOA: 06/12/2020  PCP: Beckie Salts, MD  Admit date: 06/12/2020 Discharge date: 06/14/2020  Admitted From: Home Disposition: Home  Recommendations for Outpatient Follow-up:  1. Follow ups as below. 2. Please obtain CBC/BMP/Mag at follow up 3. Please follow up on the following pending results: None  Home Health: None required Equipment/Devices: None required  Discharge Condition: Stable CODE STATUS: Full code   Follow-up Information    Beckie Salts, MD. Schedule an appointment as soon as possible for a visit in 1 week(s).   Specialty: Internal Medicine               Hospital Course: 55 year old M with PMH of ESRD on HD MWF, DM-2, HTN, chronic N/V/D/abdominal pain and diverticular disease presenting with with complaints of N/V/D/abdominal pain for 4 to 5 days atypical of his chronic N/V/pain.  Patient has had work-up including EGD and colonoscopy in March 2022.  Apparently, his GI came to a conclusion that patient might have painful diverticular disease and suggested outpatient follow-up to discuss possible elective hemicolectomy, and recommended avoiding opiates.  Patient also had chest pain, shortness of breath and subjective fever for couple of days.  In ED, CT chest/abdomen/pelvis with pancolonic diverticulosis, previously noted circumferential bowel wall thickening of the rectosigmoid colon, RLL groundglass opacity concerning for infectious or inflammatory etiology, and innumerable lucent lesions throughout axial and appendicular skeleton likely due to renal osteodystrophy versus MM or metastatic disease.  Patient was started on IV ceftriaxone and azithromycin for community-acquired pneumonia, and Zofran and simethicone for his GI symptoms   On the day of discharge, patient felt well and ready to go home.  He tolerated soft diet.  He has not had further emesis.  Abdominal pain improved.  Nephrology  navigator contacted his dialysis center and confirmed dialysis chair after discharge.  He has been discharged on p.o. Augmentin and azithromycin to complete treatment course for community-acquired pneumonia.  I have renewed his prescription for Zofran.  He will follow-up with his gastroenterologist for further care on his GI symptoms.   Discharge Diagnoses:  Acute on chronic nausea/vomiting/abdominal distention/pain: Improved.  Tolerated soft diet. -CT scan shows pandiverticulosis with bowel wall thickening about rectosigmoid colon -Colonoscopy done in March did not mention anything about malignancy -Recently had colonoscopy and EGD at [facility] felt that he has painful diverticulosis. He has outpatient follow up to discuss hemicolectomy  -Continue Zofran as needed   Right lower lobe pneumonia: Improved. 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  Renal navigator called his dialysis center for outpatient dialysis after discharge  Hypertension: Normotensive -Continue home medications  Controlled diabetes mellitus type 2-not on medications  Chronic COPD -Continue home inhalers  Anxiety and depression: Stable -Continue home medications.  Morbid obesity Body mass index is 44.69 kg/m.            Discharge Exam: Vitals:   06/14/20 0429 06/14/20 0948  BP: 131/71 114/60  Pulse: 83 83  Resp: 20 20  Temp: 98.8 F (37.1 C) 99.4 F (37.4 C)  SpO2: 94% 95%    GENERAL: No apparent distress.  Nontoxic. HEENT: MMM.  Vision and hearing grossly intact.  NECK: Supple.  No apparent JVD.  RESP: On RA no IWOB.  Fair aeration bilaterally. CVS:  RRR. Heart sounds normal.  ABD/GI/GU: Bowel sounds present. Soft. Non tender.  Limited exam due to body  habitus MSK/EXT:  Moves extremities. No apparent deformity. No edema.  SKIN: no apparent skin lesion or wound NEURO: Awake, alert and oriented appropriately.  No apparent focal neuro deficit. PSYCH: Calm. Normal affect.   Discharge Instructions  Discharge Instructions    Call MD for:  difficulty breathing, headache or visual disturbances   Complete by: As directed    Call MD for:  extreme fatigue   Complete by: As directed    Call MD for:  persistant dizziness or light-headedness   Complete by: As directed    Call MD for:  persistant nausea and vomiting   Complete by: As directed    Call MD for:  severe uncontrolled pain   Complete by: As directed    Call MD for:  temperature >100.4   Complete by: As directed    Diet - low sodium heart healthy   Complete by: As directed    Discharge instructions   Complete by: As directed    It has been a pleasure taking care of you!  You were hospitalized with pneumonia (lung infection).  You have been treated with antibiotics and your symptoms improved to the point we think it is safe to let you go home and follow-up with your primary care doctor.  It is not quite clear what could be causing your nausea, vomiting or abdominal pain but your symptoms improved. You may continue using your zofran as needed for nausea and vomiting. Please follow up with your gastroenterologist for further evaluation.   Please review your new medication list and the directions before you take your medications.  Take care,   Increase activity slowly   Complete by: As directed      Allergies as of 06/14/2020      Reactions   Quetiapine    Other reaction(s): Confusion, Mental Status Changes (intolerance), Other (see comments) violent violent violent violent   Trazodone And Nefazodone    Other reaction(s): Hallucinations, Other (see comments) hallucinations hallucinations   Depakote [divalproex Sodium]    Ivp Dye [iodinated Diagnostic Agents]    Keflex  [cephalexin]    Got ANCEF 2 weeks ago with no allergic rxn. Getting Rocephin in ED right now...   Other    Seroquil       Medication List    STOP taking these medications   budesonide-formoterol 160-4.5 MCG/ACT inhaler Commonly known as: SYMBICORT   labetalol 200 MG tablet Commonly known as: NORMODYNE     TAKE these medications   amLODipine 10 MG tablet Commonly known as: NORVASC Take 10 mg by mouth daily.   amoxicillin-clavulanate 500-125 MG tablet Commonly known as: Augmentin Take 1 tablet (500 mg total) by mouth daily. Take additional one tablet (54m) after dialysis.   ascorbic acid 1000 MG tablet Commonly known as: VITAMIN C Take 1,000 mg by mouth at bedtime.   aspirin 81 MG EC tablet Take 81 mg by mouth daily.   atorvastatin 40 MG tablet Commonly known as: LIPITOR Take 40 mg by mouth at bedtime.   azithromycin 250 MG tablet Commonly known as: Zithromax Take 1 tablet (250 mg total) by mouth daily.   buPROPion 300 MG 24 hr tablet Commonly known as: WELLBUTRIN XL Take 300 mg by mouth daily.   carvedilol 25 MG tablet Commonly known as: COREG Take 25 mg by mouth 2 (two) times daily.   escitalopram 10 MG tablet Commonly known as: LEXAPRO Take 20 mg  by mouth daily.   famotidine 20 MG tablet Commonly known as: PEPCID Take 20 mg by mouth daily.   Fish Oil 1000 MG Caps Take 2 g by mouth daily.   furosemide 20 MG tablet Commonly known as: LASIX Take 20 mg by mouth daily.   iron polysaccharides 150 MG capsule Commonly known as: NIFEREX Take 150 mg by mouth See admin instructions. Monday,wednesday and Friday at dialysisi   lactulose 10 GM/15ML solution Commonly known as: CHRONULAC Take 45 mLs by mouth daily as needed for mild constipation.   latanoprost 0.005 % ophthalmic solution Commonly known as: XALATAN Place 1 drop into both eyes at bedtime.   nitroGLYCERIN 0.4 MG SL tablet Commonly known as: NITROSTAT Place 0.4 mg under the tongue as  needed for chest pain.   ondansetron 4 MG disintegrating tablet Commonly known as: ZOFRAN-ODT Take 1 tablet (4 mg total) by mouth every 8 (eight) hours as needed for vomiting or nausea.   pantoprazole 40 MG tablet Commonly known as: PROTONIX Take 40 mg by mouth daily.   senna-docusate 8.6-50 MG tablet Commonly known as: Senokot-S Take 2 tablets by mouth at bedtime as needed for mild constipation.   sevelamer carbonate 800 MG tablet Commonly known as: RENVELA Take 1,600 mg by mouth See admin instructions. 2 tabs with meals and snacks   Trelegy Ellipta 200-62.5-25 MCG/INH Aepb Generic drug: Fluticasone-Umeclidin-Vilant Inhale 1 puff into the lungs daily.   vitamin E 45 MG (100 UNITS) capsule Take 45 Units by mouth at bedtime.   zinc sulfate 220 (50 Zn) MG capsule Take 50 mg by mouth at bedtime.       Consultations:  Nephrology  Procedures/Studies:   DG Chest 2 View  Result Date: 06/12/2020 CLINICAL DATA:  Chest pain, shortness of breath EXAM: CHEST - 2 VIEW COMPARISON:  05/28/2020 FINDINGS: Dual lumen right-sided central venous catheter in unchanged position. No focal consolidation. No pleural effusion or pneumothorax. Heart and mediastinal contours are unremarkable. No acute osseous abnormality. IMPRESSION: No active cardiopulmonary disease. Electronically Signed   By: Kathreen Devoid   On: 06/12/2020 15:38   MR CERVICAL SPINE WO CONTRAST  Result Date: 06/13/2020 CLINICAL DATA:  Initial evaluation for cervical radiculopathy. EXAM: MRI CERVICAL SPINE WITHOUT CONTRAST TECHNIQUE: Multiplanar, multisequence MR imaging of the cervical spine was performed. No intravenous contrast was administered. COMPARISON:  None available. FINDINGS: Alignment: Straightening with slight reversal of the normal cervical lordosis, apex at C5-6. No listhesis. Vertebrae: Vertebral body height maintained without acute or chronic fracture. Bone marrow signal intensity diffusely decreased on T1 weighted  imaging, nonspecific, but likely related to patient history of end-stage renal disease and anemia. No discrete or worrisome osseous lesions. Discogenic reactive endplate change present about the C5-6 interspace. Reactive marrow edema present about the right C2-3 facet due to facet arthritis (series 7, image 3). Finding could serve as a source for neck pain. No other abnormal marrow edema. Cord: Signal intensity within the cervical spinal cord grossly within normal limits on this motion degraded exam. Normal cord caliber morphology. Posterior Fossa, vertebral arteries, paraspinal tissues: Visualized brain and posterior fossa within normal limits. Mild sphenoid sinus mucosal thickening noted. Craniocervical junction normal. Soft tissue edema noted adjacent to the inflamed right C2-3 facet. Paraspinous and prevertebral soft tissues demonstrate no other acute finding. Normal flow voids seen within the vertebral arteries bilaterally. Strongly dominant left vertebral artery with diffusely hypoplastic right vertebral artery noted. Disc levels: C2-C3: Small central disc protrusion mildly indents the ventral thecal sac. Right-sided  facet arthrosis with associated reactive marrow edema. No significant spinal stenosis. Foramina remain patent. C3-C4: Minimal disc bulge with uncovertebral hypertrophy. Right worse than left facet degeneration. No significant spinal stenosis. Foramina remain patent. C4-C5: Degenerative intervertebral disc space narrowing. Central disc protrusion indents the ventral thecal sac, contacting the ventral cord. Mild spinal stenosis. Right worse than left uncovertebral spurring with resultant mild right C5 foraminal stenosis. Left neural foramina remains patent. C5-C6: Degenerative intervertebral disc space narrowing with diffuse disc osteophyte complex, eccentric to the right. Broad posterior component flattens and effaces the ventral thecal sac. Resultant mild-to-moderate spinal stenosis with mild cord  flattening, but no appreciable cord signal changes. Moderate right worse than left C6 foraminal narrowing. C6-C7: Degenerative intervertebral disc space narrowing with diffuse disc osteophyte. Broad posterior component flattens and partially effaces the ventral thecal sac without significant spinal stenosis. Foramina remain patent. C7-T1: Negative interspace. Right-sided facet hypertrophy. No stenosis. Visualized upper thoracic spine demonstrates no significant finding. IMPRESSION: 1. Right eccentric disc osteophyte complex at C5-6 with resultant mild to moderate spinal stenosis, with moderate right worse than left C6 foraminal narrowing. 2. Central disc protrusion at C4-5 with resultant mild spinal stenosis. 3. Reactive marrow edema about the right C2-3 facet due to facet arthritis. Finding could serve as a source for neck pain. 4. Additional degenerative C6-7 without significant stenosis or impingement. Electronically Signed   By: Jeannine Boga M.D.   On: 06/13/2020 01:48   CT CHEST ABDOMEN PELVIS WO CONTRAST  Result Date: 06/12/2020 CLINICAL DATA:  Pain EXAM: CT CHEST, ABDOMEN AND PELVIS WITHOUT CONTRAST TECHNIQUE: Multidetector CT imaging of the chest, abdomen and pelvis was performed following the standard protocol without IV contrast. COMPARISON:  May 28, 2020, July 03, 2018 FINDINGS: CT CHEST FINDINGS Cardiovascular: RIGHT IJ CVC tip terminates in the SVC just at the cavoatrial junction. Trace pericardial fluid. Aortic valve calcifications. Three-vessel coronary artery atherosclerotic calcifications. Heart is at the upper limits of normal in size. Atherosclerotic calcifications of the aorta. Mediastinum/Nodes: Visualized thyroid is unremarkable. No axillary or mediastinal adenopathy. Lungs/Pleura: No pleural effusion or pneumothorax. Centrilobular ground-glass opacity of the RIGHT lower lobe (series 5, image 92). Musculoskeletal: Remote RIGHT anterior fifth rib fracture. There are multiple small  lucent lesions throughout the axial and appendicular skeleton on a background diffuse sclerosis. CT ABDOMEN PELVIS FINDINGS Hepatobiliary: Unremarkable noncontrast appearance of the liver. Status post cholecystectomy. No extrahepatic biliary ductal dilation. Pancreas: No peripancreatic fat stranding. Spleen: Unremarkable. Adrenals/Urinary Tract: Adrenal glands are unremarkable. Atrophic kidneys. Multiple bilateral incompletely assessed hypodense renal lesions. Favored bilateral nonobstructive nephrolithiasis as well as vascular calcifications. Bladder is decompressed. Stomach/Bowel: No evidence of bowel obstruction. There is extensive diverticulosis. Normal appendix. Similar appearance of mild bowel wall thickening of the rectosigmoid colon. No new adjacent fat stranding. Vascular/Lymphatic: Atherosclerotic calcifications. No new suspicious lymphadenopathy. Reproductive: Prostate is present. Other: No free air or free fluid. Musculoskeletal: Transitional anatomy with bilateral assimilation joints at L5-S1. There are multiple small lucent lesions throughout the axial and appendicular skeleton on a background of diffuse sclerosis. These have increased in conspicuity since more remote prior studies. IMPRESSION: 1. Pancolonic diverticulosis without definitive evidence of acute diverticulitis. Again noted is circumferential bowel wall thickening of the rectosigmoid colon. This could reflect chronic inflammation or malignancy. Recommend correlation with colon cancer screening history. 2. Small area of centrilobular ground-glass opacity in the RIGHT lower lobe, likely infectious or inflammatory in etiology. 3. Innumerable lucent lesions throughout the axial and appendicular skeleton superimposed on a background of diffuse sclerosis.  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 Differential considerations include sequela of hyperparathyroidism, multiple myeloma or less likely metastatic disease. Recommend  correlation with laboratory values. Aortic Atherosclerosis (ICD10-I70.0). Electronically Signed   By: Valentino Saxon MD   On: 06/12/2020 16:59        The results of significant diagnostics from this hospitalization (including imaging, microbiology, ancillary and laboratory) are listed below for reference.     Microbiology: Recent Results (from the past 240 hour(s))  Resp Panel by RT-PCR (Flu A&B, Covid) Nasopharyngeal Swab     Status: None   Collection Time: 06/12/20  4:09 PM   Specimen: Nasopharyngeal Swab; Nasopharyngeal(NP) swabs in vial transport medium  Result Value Ref Range Status   SARS Coronavirus 2 by RT PCR NEGATIVE NEGATIVE Final    Comment: (NOTE) SARS-CoV-2 target nucleic acids are NOT DETECTED.  The SARS-CoV-2 RNA is generally detectable in upper respiratory specimens during the acute phase of infection. The lowest concentration of SARS-CoV-2 viral copies this assay can detect is 138 copies/mL. A negative result does not preclude SARS-Cov-2 infection and should not be used as the sole basis for treatment or other patient management decisions. A negative result may occur with  improper specimen collection/handling, submission of specimen other than nasopharyngeal swab, presence of viral mutation(s) within the areas targeted by this assay, and inadequate number of viral copies(<138 copies/mL). A negative result must be combined with clinical observations, patient history, and epidemiological information. The expected result is Negative.  Fact Sheet for Patients:  EntrepreneurPulse.com.au  Fact Sheet for Healthcare Providers:  IncredibleEmployment.be  This test is no t yet approved or cleared by the [place] FDA and  has been authorized for detection and/or diagnosis of SARS-CoV-2 by FDA under an Emergency Use Authorization (EUA). This EUA will remain  in effect (meaning this test can be used) for the duration of  the COVID-19 declaration under Section 564(b)(1) of the Act, 21 U.S.C.section 360bbb-3(b)(1), unless the authorization is terminated  or revoked sooner.       Influenza A by PCR NEGATIVE NEGATIVE Final   Influenza B by PCR NEGATIVE NEGATIVE Final    Comment: (NOTE) The Xpert Xpress SARS-CoV-2/FLU/RSV plus assay is intended as an aid in the diagnosis of influenza from Nasopharyngeal swab specimens and should not be used as a sole basis for treatment. Nasal washings and aspirates are unacceptable for Xpert Xpress SARS-CoV-2/FLU/RSV testing.  Fact Sheet for Patients: EntrepreneurPulse.com.au  Fact Sheet for Healthcare Providers: IncredibleEmployment.be  This test is not yet approved or cleared by the [place] FDA and has been authorized for detection and/or diagnosis of SARS-CoV-2 by FDA under an Emergency Use Authorization (EUA). This EUA will remain in effect (meaning this test can be used) for the duration of the COVID-19 declaration under Section 564(b)(1) of the Act, 21 U.S.C. section 360bbb-3(b)(1), unless the authorization is terminated or revoked.  Performed at [facility], [place] [address] [place], [place] [address]   Blood culture (routine x 2)     Status: None (Preliminary result)   Collection Time: 06/12/20  5:31 PM   Specimen: BLOOD  Result Value Ref Range Status   Specimen Description BLOOD SITE NOT SPECIFIED  Final   Special Requests   Final    BOTTLES DRAWN AEROBIC AND ANAEROBIC Blood Culture adequate volume   Culture   Final    NO GROWTH 2 DAYS Performed at [facility] [address], [place], [place] [address]    Report Status PENDING  Incomplete  MRSA PCR Screening     Status: None   Collection Time: 06/13/20  6:46 AM   Specimen: Nasal Mucosa; Nasopharyngeal  Result Value Ref Range Status   MRSA by PCR NEGATIVE NEGATIVE Final    Comment:        The GeneXpert MRSA Assay (FDA approved for NASAL  specimens only), is one component of a comprehensive MRSA colonization surveillance program. It is not intended to diagnose MRSA infection nor to guide or monitor treatment for MRSA infections. Performed at [facility], [place] [address] [place], [place] [address]      Labs:  CBC: Recent Labs  Lab 06/12/20 1731  WBC 7.9  NEUTROABS 5.7  HGB 9.4*  HCT 28.6*  MCV 94.7  PLT 287   BMP &GFR Recent Labs  Lab 06/12/20 1731  NA 135  K 4.0  CL 98  CO2 25  GLUCOSE 91  BUN 29*  CREATININE 5.75*  CALCIUM 9.3   Estimated Creatinine Clearance: 16.8 mL/min (A) (by C-G formula based on SCr of 5.75 mg/dL (H)). Liver & Pancreas: Recent Labs  Lab 06/12/20 1731  AST 13*  ALT 12  ALKPHOS 68  BILITOT 0.4  PROT 6.7  ALBUMIN 3.0*   Recent Labs  Lab 06/12/20 1731  LIPASE 35   No results for input(s): AMMONIA in the last 168 hours. Diabetic: No results for input(s): HGBA1C in the last 72 hours. No results for input(s): GLUCAP in the last 168 hours. Cardiac Enzymes: Recent Labs  Lab 06/12/20 1731  CKTOTAL 35*   No results for input(s): PROBNP in the last 8760 hours. Coagulation Profile: No results for input(s): INR, PROTIME in the last 168 hours. Thyroid Function Tests: No results for input(s): TSH, T4TOTAL, FREET4, T3FREE, THYROIDAB in the last 72 hours. Lipid Profile: No results for input(s): CHOL, HDL, LDLCALC, TRIG, CHOLHDL, LDLDIRECT in the last 72 hours. Anemia Panel: No results for input(s): VITAMINB12, FOLATE, FERRITIN, TIBC, IRON, RETICCTPCT in the last 72 hours. Urine analysis: No results found for: COLORURINE, APPEARANCEUR, LABSPEC, PHURINE, GLUCOSEU, HGBUR, BILIRUBINUR, KETONESUR, PROTEINUR, UROBILINOGEN, NITRITE, LEUKOCYTESUR Sepsis Labs: Invalid input(s): PROCALCITONIN, LACTICIDVEN   Time coordinating discharge: 35 minutes  SIGNED:  Mercy Riding, MD  [facility] 06/14/2020, 9:46 PM  If 7PM-7AM, please contact  night-coverage [email or web address]

## 2020-06-17 LAB — CULTURE, BLOOD (ROUTINE X 2)
Culture: NO GROWTH
Special Requests: ADEQUATE

## 2021-07-14 DEATH — deceased

## 2022-12-23 IMAGING — CT CT CHEST-ABD-PELV W/O CM
2 of 4 series · 15 of 46 positions shown, 17 images · non-contrast
Comparison: [DATE] [DATE], [DATE], [DATE] [DATE], [DATE]

CLINICAL DATA: Pain

EXAM:
CT CHEST, ABDOMEN AND PELVIS WITHOUT CONTRAST
TECHNIQUE: Multidetector CT imaging of the chest, abdomen and pelvis was
performed following the standard protocol without IV contrast.

[Series 3: cap without · axial · non-contrast · 0.94mm/px · z∈[+747,+1312]mm · 12 of 133 slices shown, 14 images]
[im 10/133  soft-tissue]
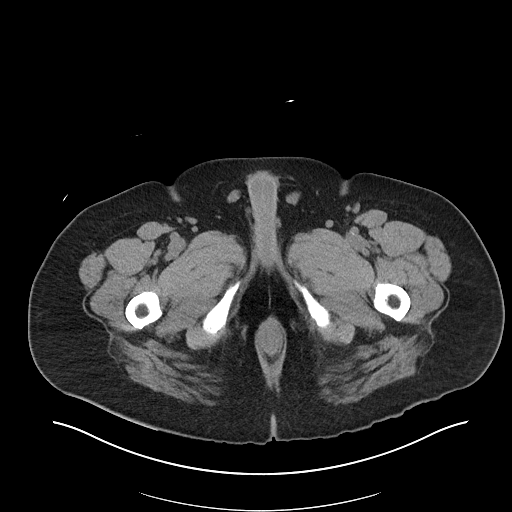
[im 10/133  bone]
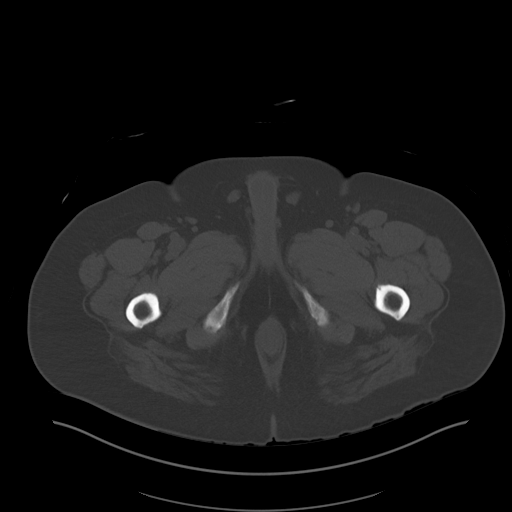
[im 19/133  soft-tissue]
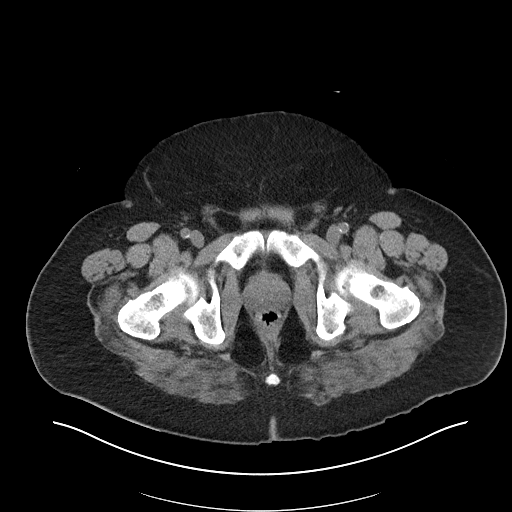
[im 29/133  soft-tissue]
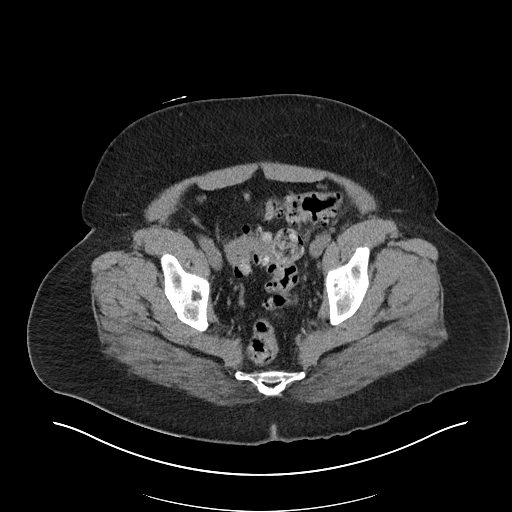
[im 38/133  soft-tissue]
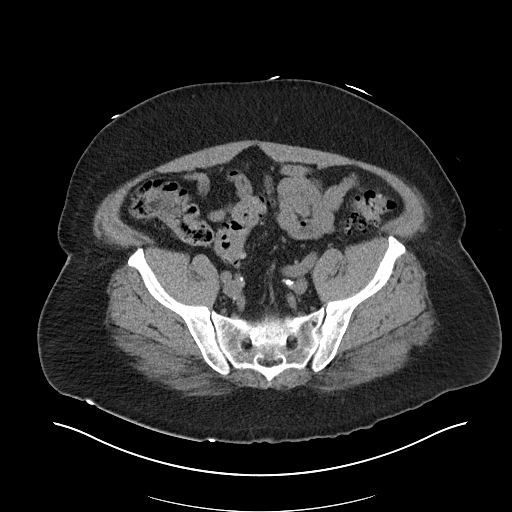
[im 48/133  soft-tissue]
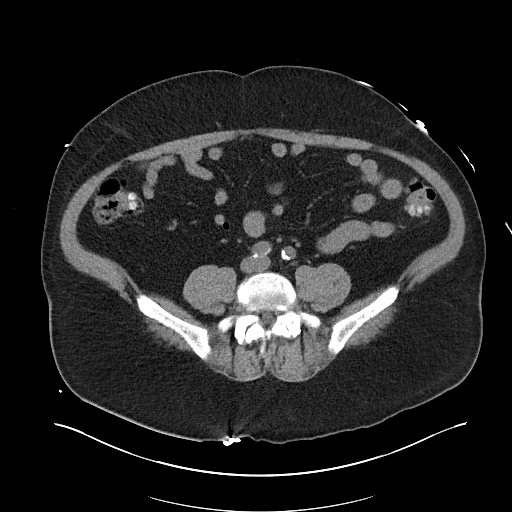
[im 57/133  soft-tissue]
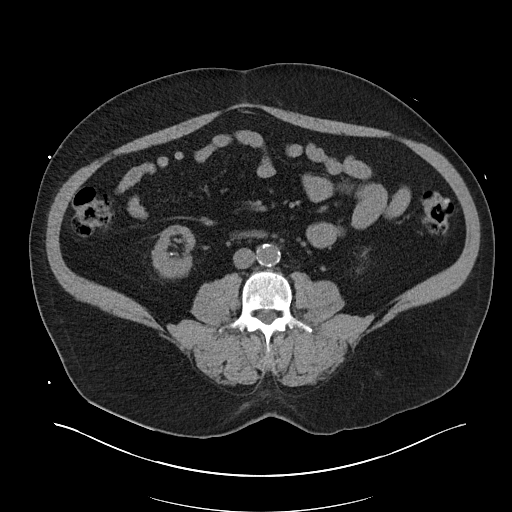
[im 76/133  soft-tissue]
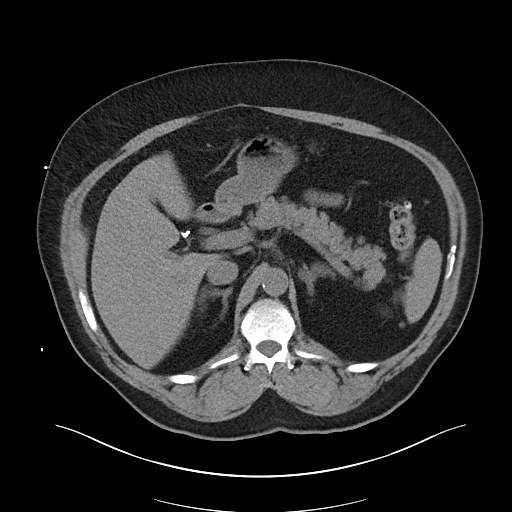
[im 85/133  soft-tissue]
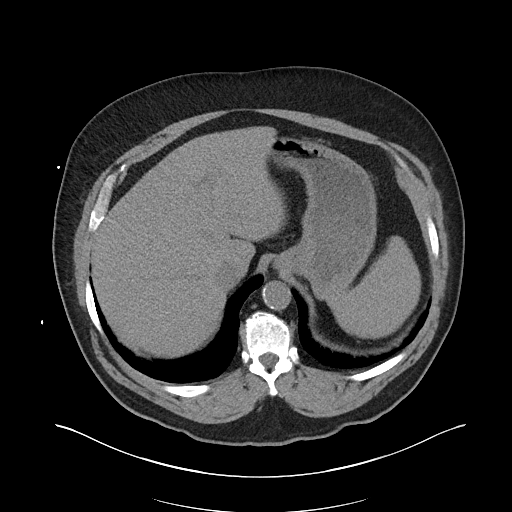
[im 95/133  soft-tissue]
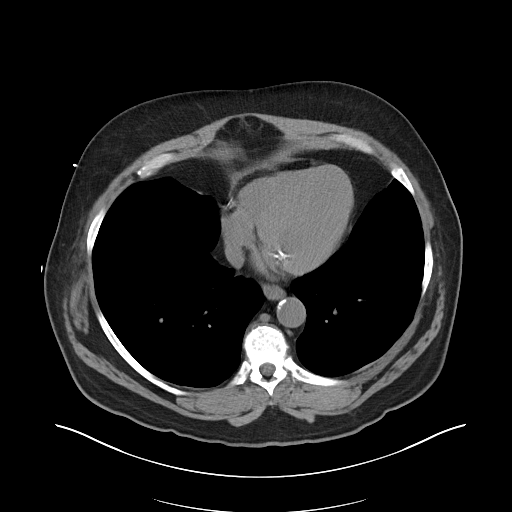
[im 95/133  bone]
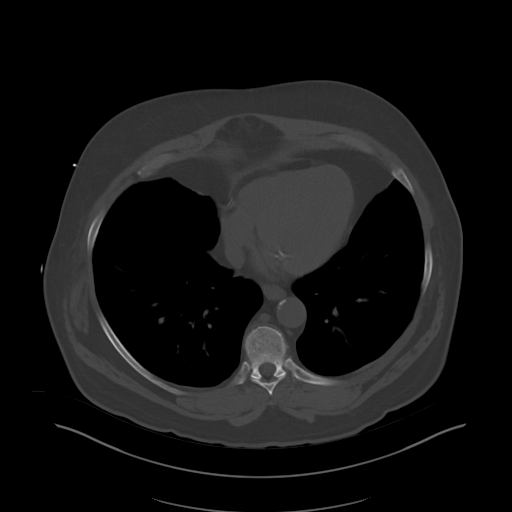
[im 104/133  soft-tissue]
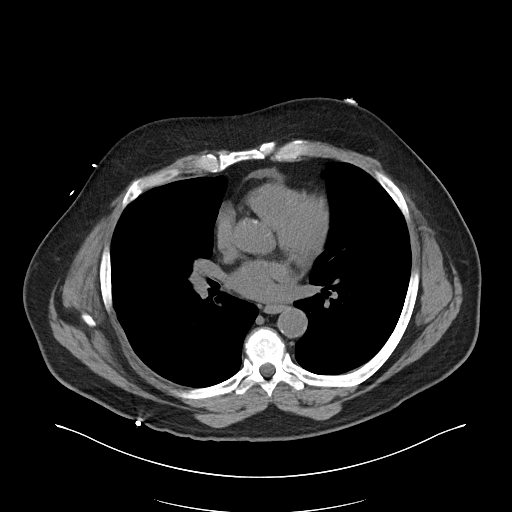
[im 114/133  soft-tissue]
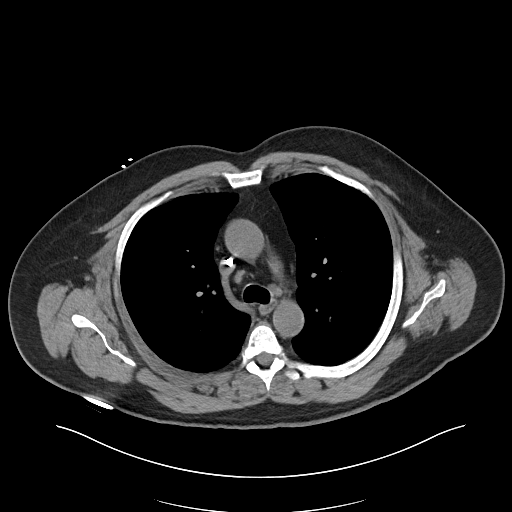
[im 123/133  soft-tissue]
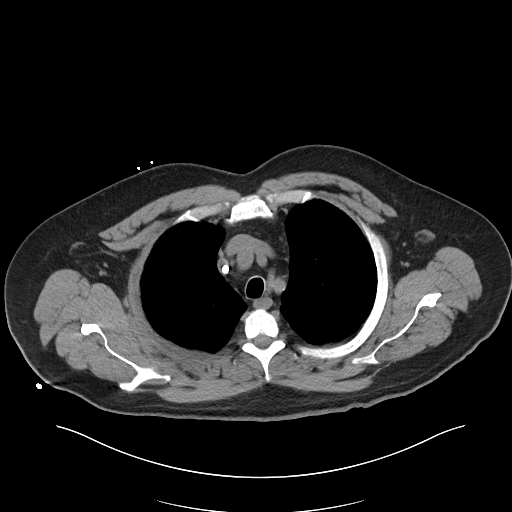

[Series 6: cor · coronal · 0.95mm/px · 3 of 123 slices shown]
[im 41/123  soft-tissue]
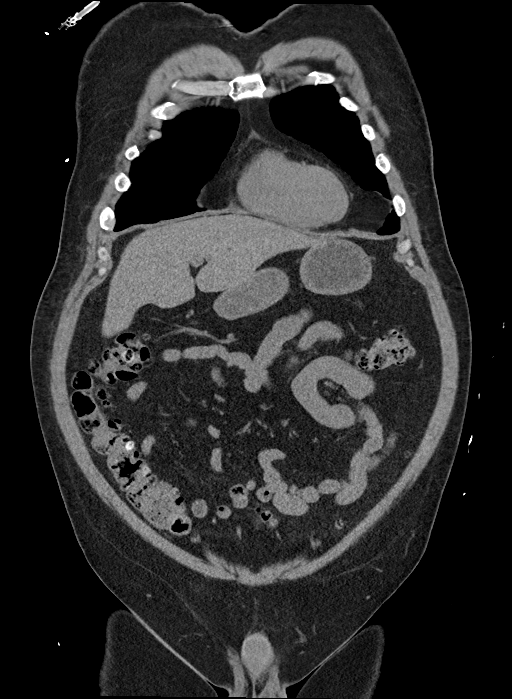
[im 55/123  soft-tissue]
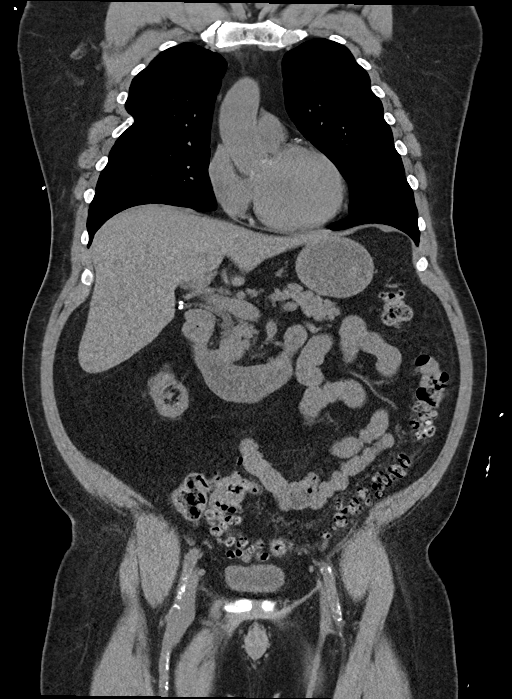
[im 68/123  soft-tissue]
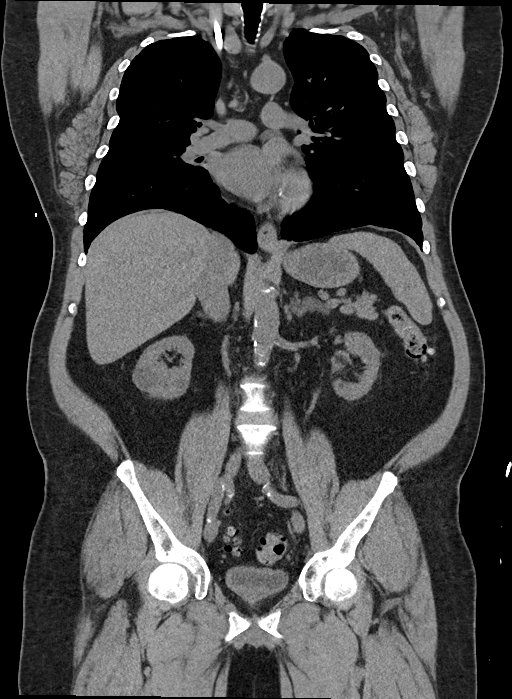

[15 of 46 positions shown; findings below may reference images not displayed]

FINDINGS: CT CHEST FINDINGS

Cardiovascular: RIGHT IJ CVC tip terminates in the SVC just at the
cavoatrial junction. Trace pericardial fluid. Aortic valve
calcifications. Three-vessel coronary artery atherosclerotic
calcifications. Heart is at the upper limits of normal in size.
Atherosclerotic calcifications of the aorta.

Mediastinum/Nodes: Visualized thyroid is unremarkable. No axillary
or mediastinal adenopathy.

Lungs/Pleura: No pleural effusion or pneumothorax. Centrilobular
ground-glass opacity of the RIGHT lower lobe (series 5, image 92).

Musculoskeletal: Remote RIGHT anterior fifth rib fracture. There are
multiple small lucent lesions throughout the axial and appendicular
skeleton on a background diffuse sclerosis.

CT ABDOMEN PELVIS FINDINGS

Hepatobiliary: Unremarkable noncontrast appearance of the liver.
Status post cholecystectomy. No extrahepatic biliary ductal
dilation.

Pancreas: No peripancreatic fat stranding.

Spleen: Unremarkable.

Adrenals/Urinary Tract: Adrenal glands are unremarkable. Atrophic
kidneys. Multiple bilateral incompletely assessed hypodense renal
lesions. Favored bilateral nonobstructive nephrolithiasis as well as
vascular calcifications. Bladder is decompressed.

Stomach/Bowel: No evidence of bowel obstruction. There is extensive
diverticulosis. Normal appendix. Similar appearance of mild bowel
wall thickening of the rectosigmoid colon. No new adjacent fat
stranding.

Vascular/Lymphatic: Atherosclerotic calcifications. No new
suspicious lymphadenopathy.

Reproductive: Prostate is present.

Other: No free air or free fluid.

Musculoskeletal: Transitional anatomy with bilateral assimilation
joints at L5-S1. There are multiple small lucent lesions throughout
the axial and appendicular skeleton on a background of diffuse
sclerosis. These have increased in conspicuity since more remote
prior studies.
IMPRESSION: 1. Pancolonic diverticulosis without definitive evidence of acute
diverticulitis. Again noted is circumferential bowel wall thickening
of the rectosigmoid colon. This could reflect chronic inflammation
or malignancy. Recommend correlation with colon cancer screening
history.
2. Small area of centrilobular ground-glass opacity in the RIGHT
lower lobe, likely infectious or inflammatory in etiology.
3. Innumerable lucent lesions throughout the axial and appendicular
skeleton superimposed on a background of diffuse sclerosis. This
likely reflects underlying renal osteodystrophy. Differential
considerations include sequela of hyperparathyroidism, multiple
myeloma or less likely metastatic disease. Recommend correlation
with laboratory values.

Aortic Atherosclerosis ([id number]-G11.1).
